# Patient Record
Sex: Male | Born: 1960 | Race: Black or African American | Hispanic: No | Marital: Single | Smoking: Current every day smoker
Health system: Southern US, Community
[De-identification: ages and names within clinical notes are randomized; demographics above are authoritative.]

## PROBLEM LIST (undated history)

## (undated) DIAGNOSIS — I10 Essential (primary) hypertension: Secondary | ICD-10-CM

---

## 2011-07-24 ENCOUNTER — Emergency Department: Payer: Self-pay | Admitting: Emergency Medicine

## 2011-07-24 LAB — COMPREHENSIVE METABOLIC PANEL
Alkaline Phosphatase: 62 U/L (ref 50–136)
Anion Gap: 10 (ref 7–16)
BUN: 12 mg/dL (ref 7–18)
Bilirubin,Total: 0.3 mg/dL (ref 0.2–1.0)
Calcium, Total: 9.3 mg/dL (ref 8.5–10.1)
Chloride: 104 mmol/L (ref 98–107)
Creatinine: 1.26 mg/dL (ref 0.60–1.30)
EGFR (African American): >60
Glucose: 110 mg/dL — ABNORMAL HIGH (ref 65–99)
Osmolality: 285 (ref 275–301)
Potassium: 4.5 mmol/L (ref 3.5–5.1)
SGOT(AST): 25 U/L (ref 15–37)
Sodium: 143 mmol/L (ref 136–145)
Total Protein: 8.3 g/dL — ABNORMAL HIGH (ref 6.4–8.2)

## 2011-07-24 LAB — LIPASE, BLOOD: Lipase: 78 U/L (ref 73–393)

## 2011-07-24 LAB — URINALYSIS, COMPLETE
Bilirubin,UR: NEGATIVE
Blood: NEGATIVE
Ph: 7 (ref 4.5–8.0)
Squamous Epithelial: 1
WBC UR: 20 /HPF (ref 0–5)

## 2011-07-24 LAB — CBC
HCT: 43.9 % (ref 40.0–52.0)
HGB: 14.5 g/dL (ref 13.0–18.0)
MCHC: 33.1 g/dL (ref 32.0–36.0)
MCV: 88 fL (ref 80–100)
Platelet: 236 10*3/uL (ref 150–440)
RBC: 5.02 10*6/uL (ref 4.40–5.90)
RDW: 14.2 % (ref 11.5–14.5)
WBC: 10.5 10*3/uL (ref 3.8–10.6)

## 2012-09-01 IMAGING — CT CT STONE STUDY
1 of 2 series · 16 of 32 positions shown, 20 images · non-contrast
Comparison: none

REASON FOR EXAM: right flank and RUQ pain
COMMENTS:   LMP: (Male)

PROCEDURE:     CT  - CT ABDOMEN /PELVIS WO (STONE)  - July 24, 2011  [DATE]
RESULT:     CT abdomen and pelvis dated 07/24/2011.
TECHNIQUE: Helical noncontrasted 3 mm sections were obtained from lung bases
through the pubic symphysis.

[Series 2: 3mm soft tissue · axial · 0.83mm/px · z∈[-1056,-630]mm · 16 of 156 slices shown, 20 images]
[im 7/156  soft-tissue]
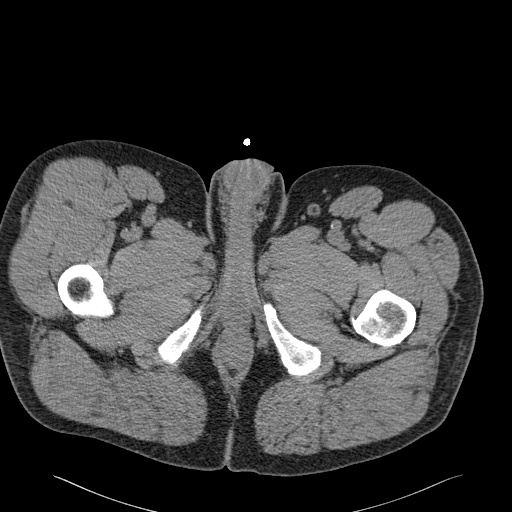
[im 7/156  bone]
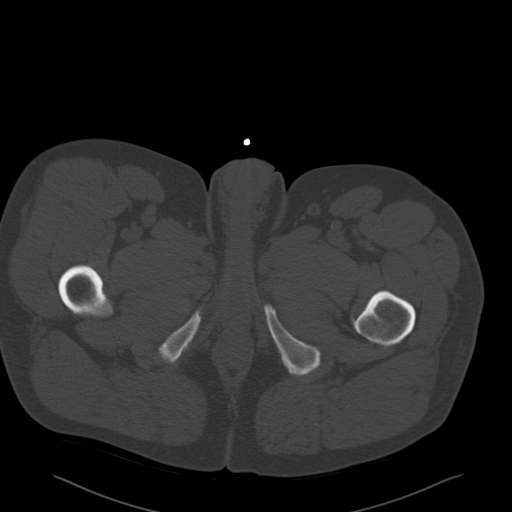
[im 20/156  soft-tissue]
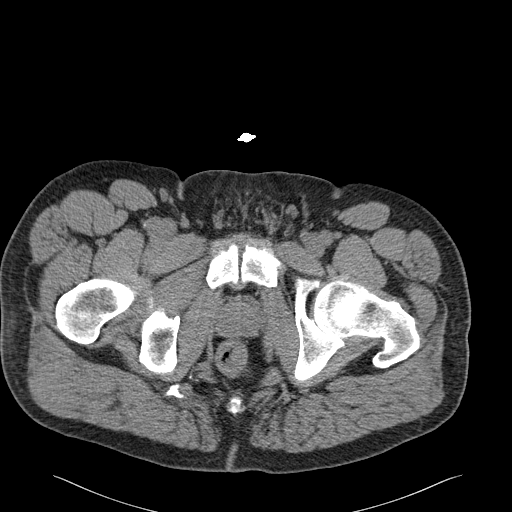
[im 33/156  soft-tissue]
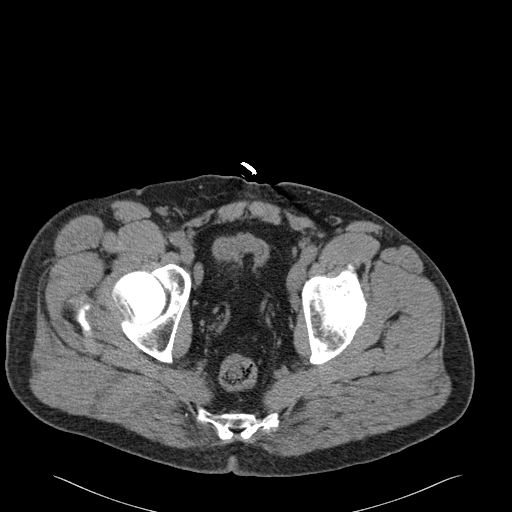
[im 39/156  soft-tissue]
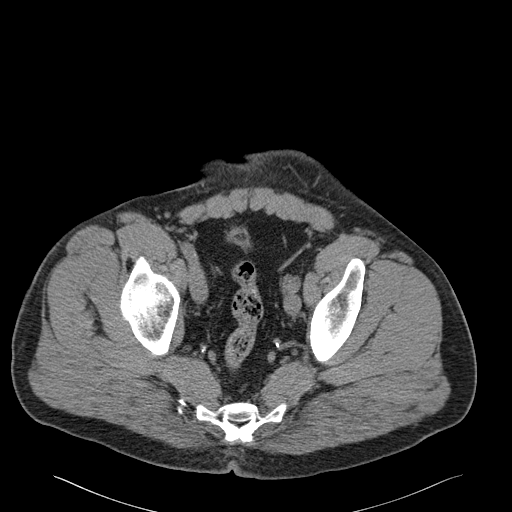
[im 52/156  soft-tissue]
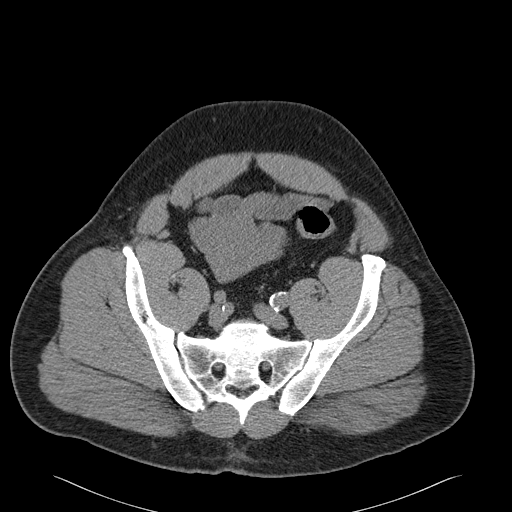
[im 65/156  soft-tissue]
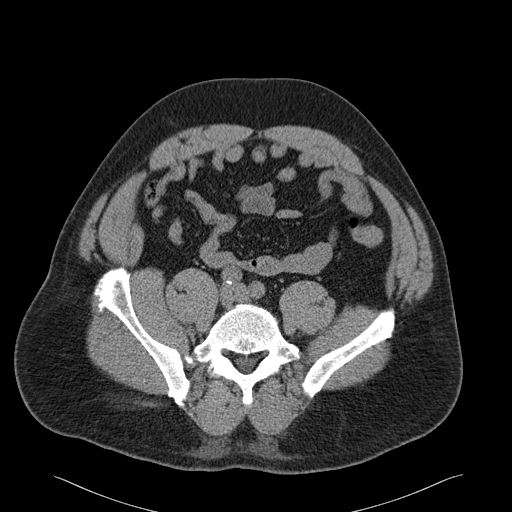
[im 72/156  soft-tissue]
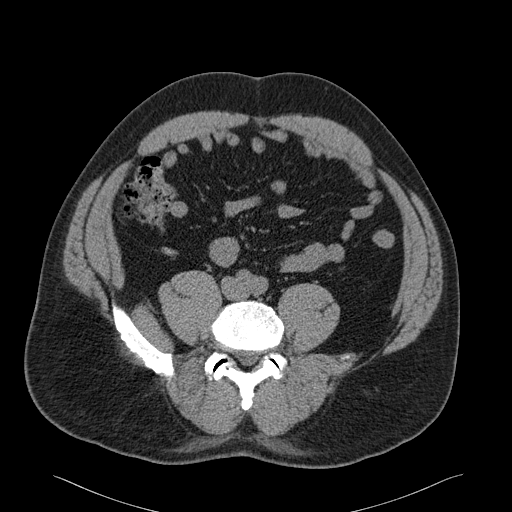
[im 84/156  soft-tissue]
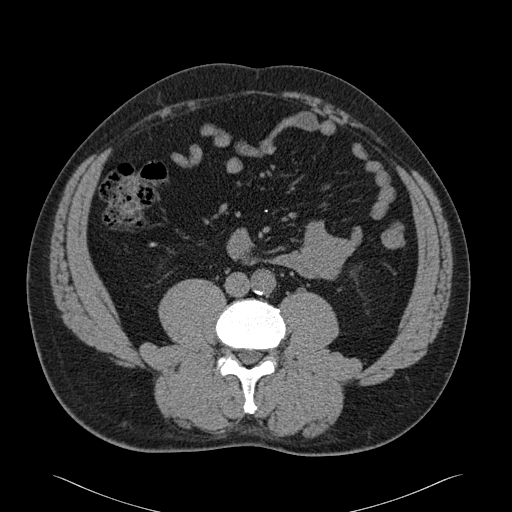
[im 91/156  soft-tissue]
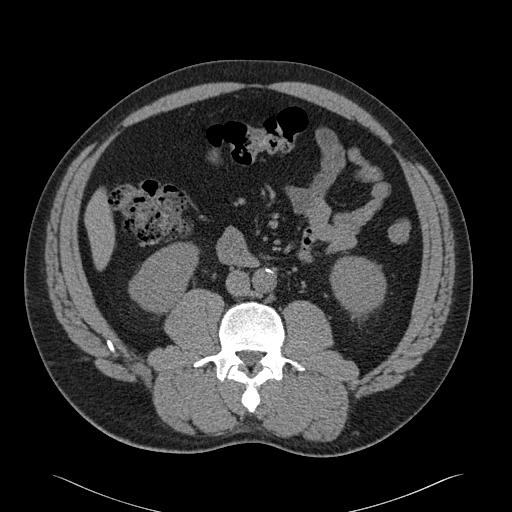
[im 91/156  bone]
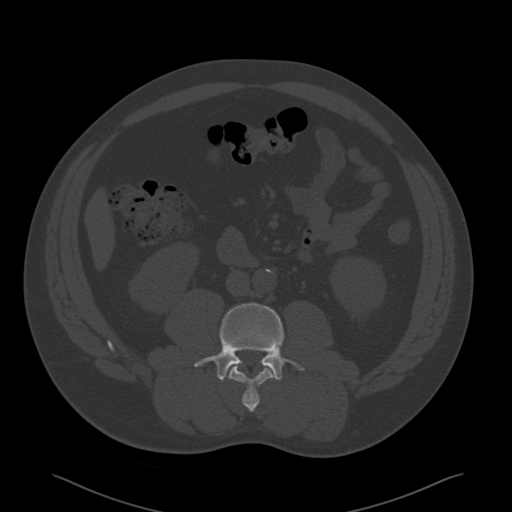
[im 104/156  soft-tissue]
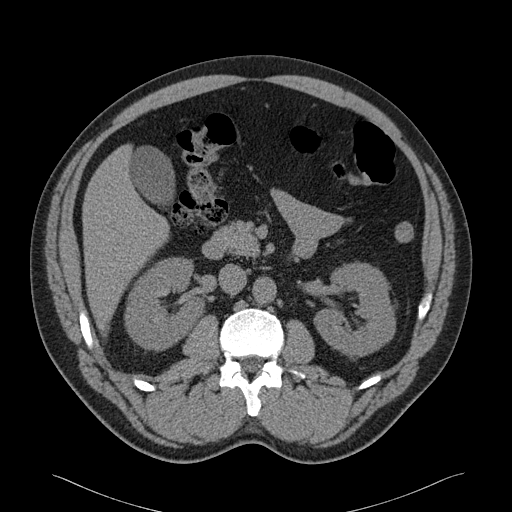
[im 117/156  soft-tissue]
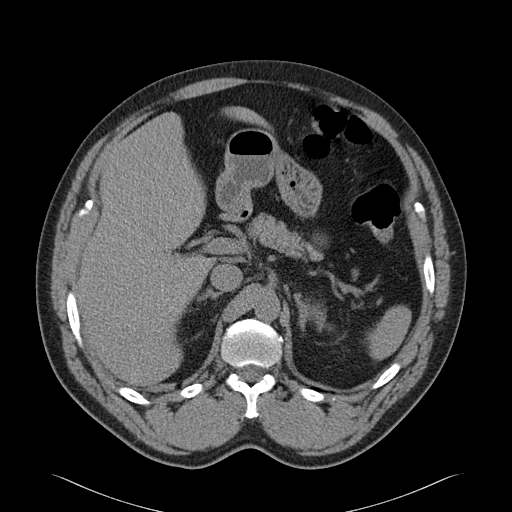
[im 123/156  soft-tissue]
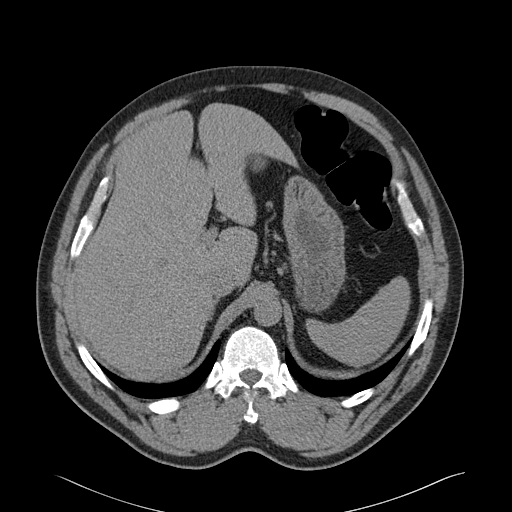
[im 130/156  lung]
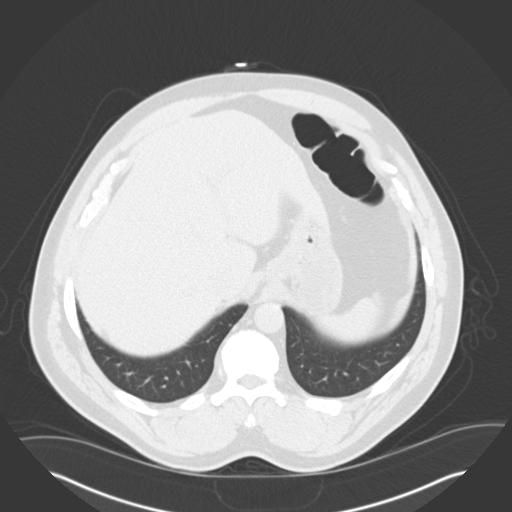
[im 136/156  soft-tissue]
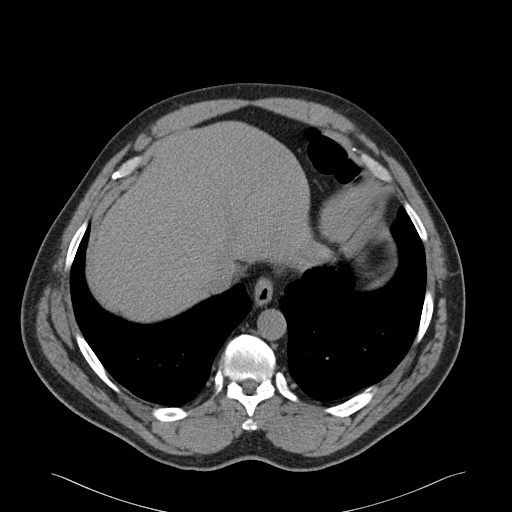
[im 136/156  lung]
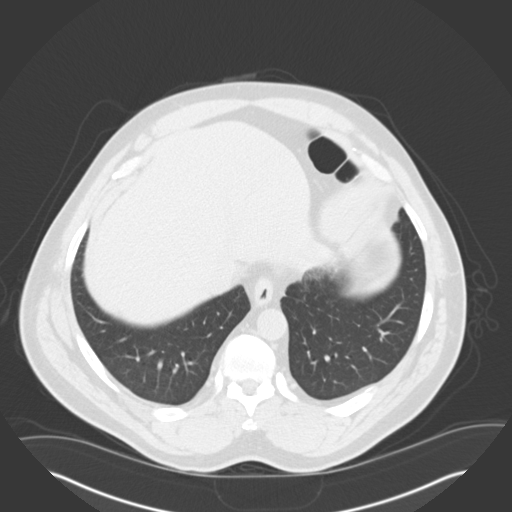
[im 143/156  lung]
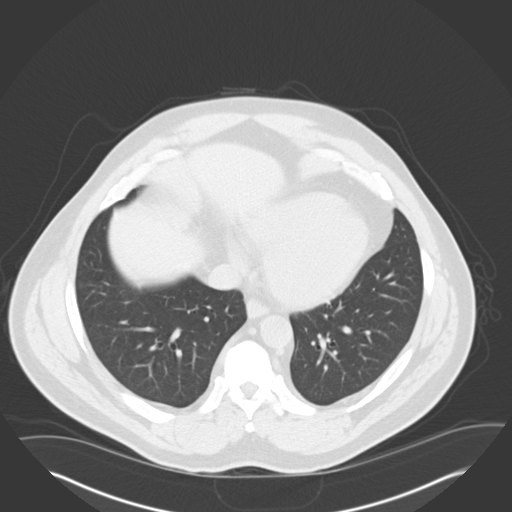
[im 149/156  soft-tissue]
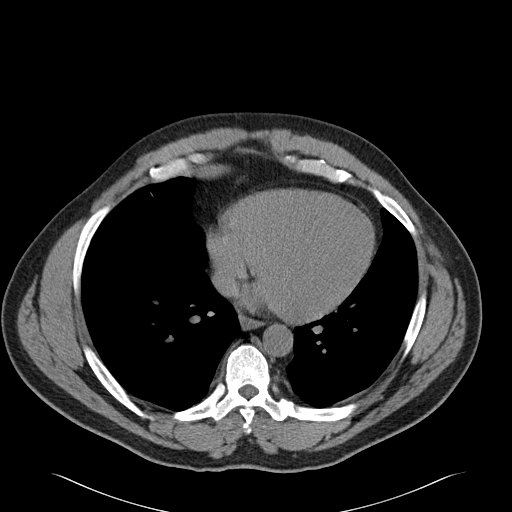
[im 149/156  lung]
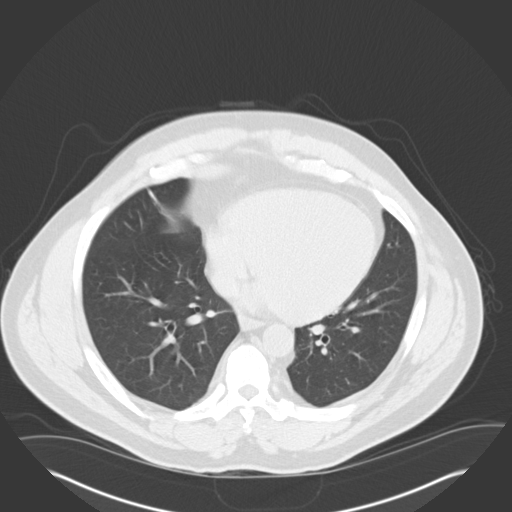

[16 of 32 positions shown; findings below may reference images not displayed]

FINDINGS: The lung bases are unremarkable.

Noncontrast evaluation of the liver, spleen, adrenals, and pancreas are
unremarkable.

There is no evidence of hydronephrosis, hydroureter, nephrolithiasis common
nor ureterolithiasis.

No CT evidence of bowel obstruction, enteritis, colitis, diverticulitis
common nor appendicitis. The appendix is identified and is unremarkable.

No evidence of abdominal aortic aneurysm, free fluid, loculated fluid
collections, nor evidence of masses within the limitations of a
noncontrasted CT.
IMPRESSION: No CT evidence of obstructive or inflammatory abnormalities.

## 2012-09-01 IMAGING — CR DG CHEST 2V
1 series · 2 of 2 positions shown · non-contrast
Comparison: none

REASON FOR EXAM: pain/sob
COMMENTS:

[Series 1: pa · 0.17mm/px · 2 of 2 slices shown]
[im 1/2]
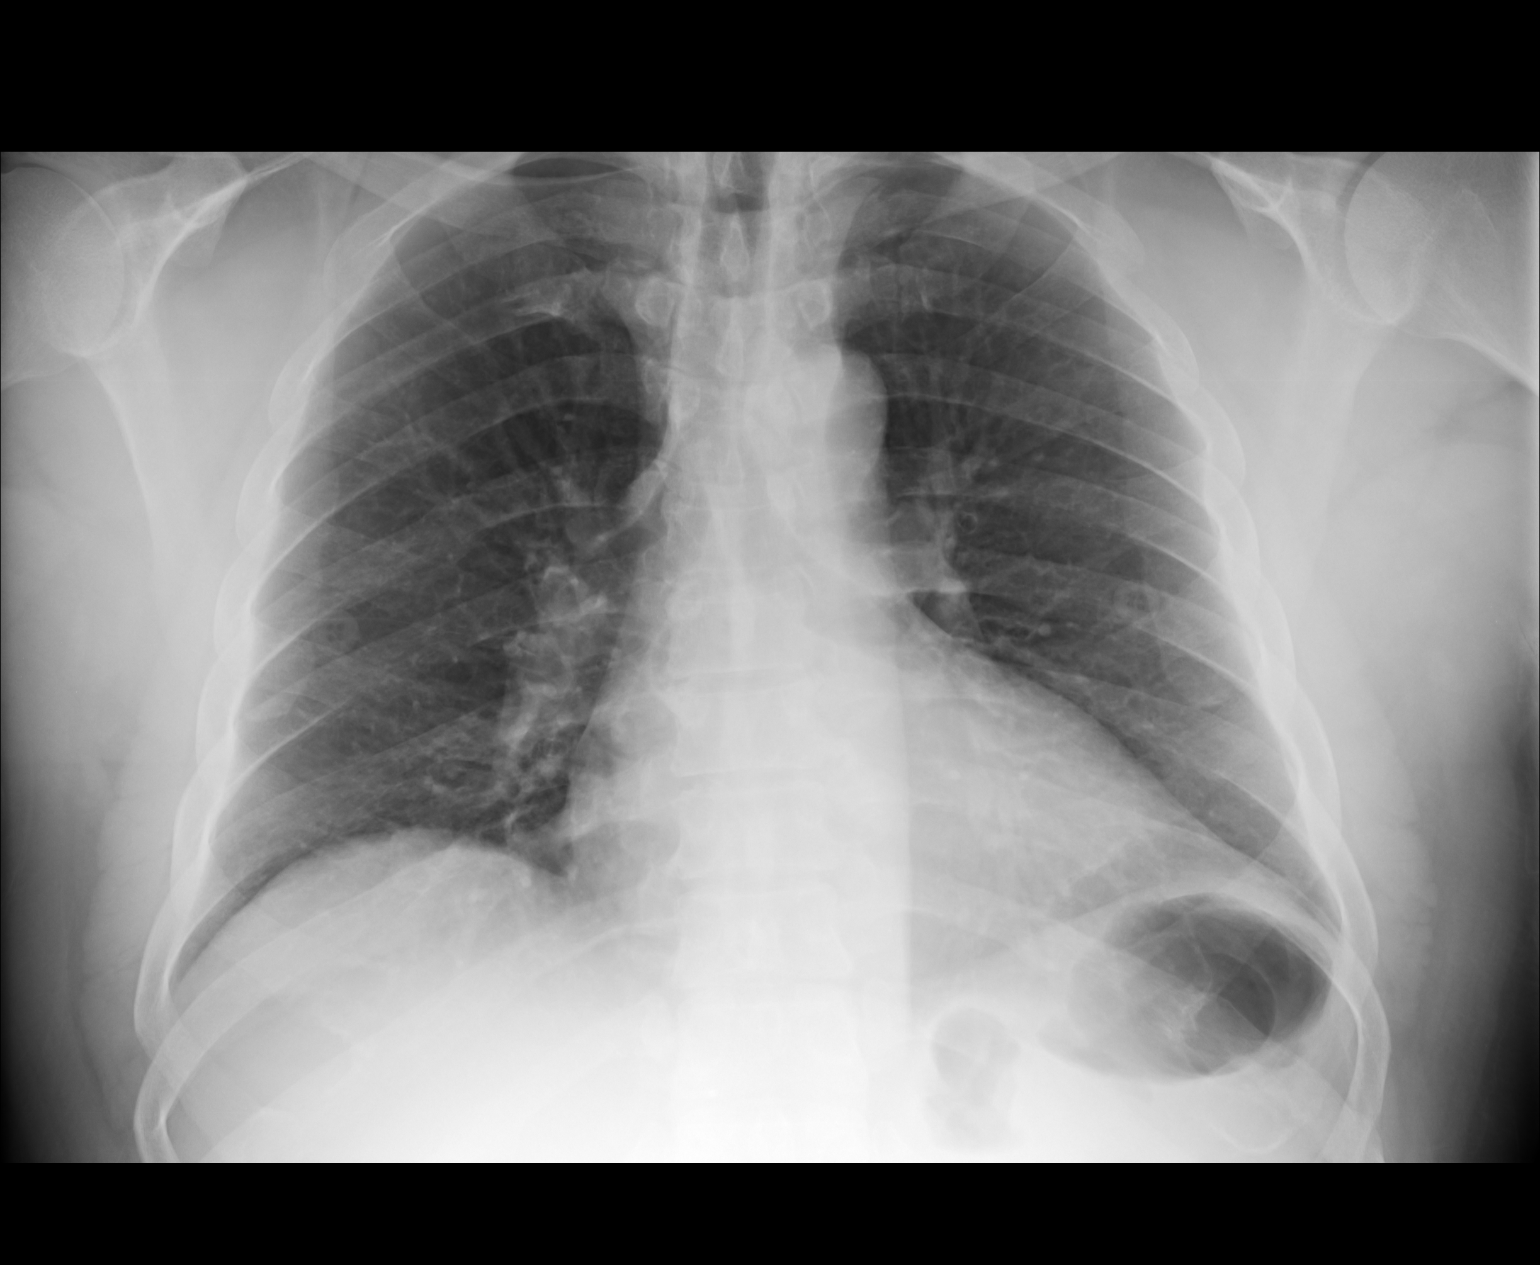
[im 2/2]
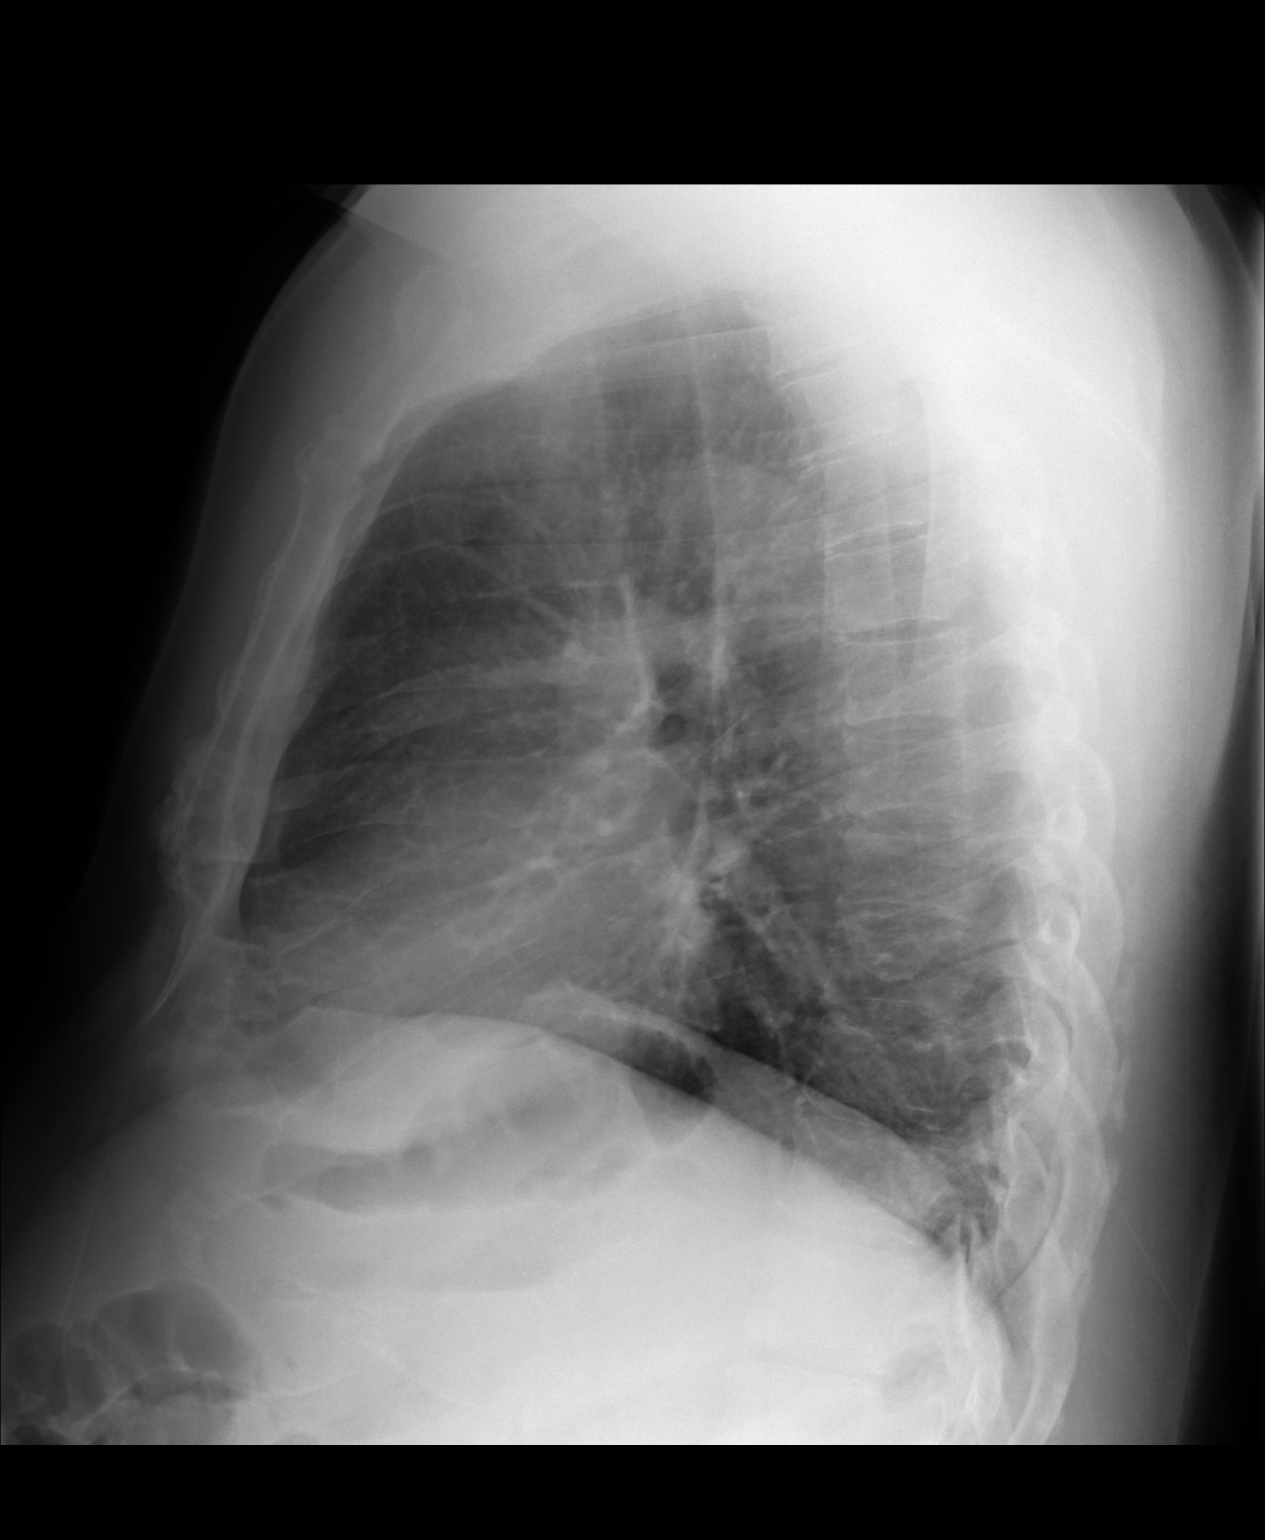

[2 of 2 positions shown; findings below may reference images not displayed]

PROCEDURE:     DXR - DXR CHEST PA (OR AP) AND LATERAL  - July 24, 2011  [DATE]

RESULT:

The patient has taken a shallow inspiration. With technique taken into
consideration, no focal regions of consolidation or focal infiltrates are
appreciated. Shallow inspiration accentuates the lung markings. The cardiac
silhouette and visualized bony skeleton are unremarkable.
IMPRESSION: Shallow inspiration without evidence of acute
cardiopulmonary disease.

## 2014-06-13 ENCOUNTER — Ambulatory Visit: Payer: Self-pay | Admitting: Family Medicine

## 2019-05-05 ENCOUNTER — Telehealth: Payer: Self-pay | Admitting: *Deleted

## 2019-05-05 NOTE — Telephone Encounter (Signed)
Received referral for low dose lung cancer screening CT scan. Message left at phone number listed in EMR for patient to call me back to facilitate scheduling scan.  

## 2019-05-11 ENCOUNTER — Telehealth: Payer: Self-pay | Admitting: *Deleted

## 2019-05-11 ENCOUNTER — Encounter: Payer: Self-pay | Admitting: *Deleted

## 2019-05-11 NOTE — Telephone Encounter (Signed)
Attempted to contact patient via phone to schedule lung screening scan. However, there is no answer or voicemail option. Will mail notification.

## 2019-06-01 ENCOUNTER — Telehealth: Payer: Self-pay | Admitting: *Deleted

## 2019-06-01 NOTE — Telephone Encounter (Signed)
Able to contact patient after multiple attempts to schedule lung screening scan. However, in discussing smoking history, patient reports his smoking history 0.5 to 0.75 ppd x 27 years. Patient is made aware that he does not currently meet eligibility requirements for lung cancer screening. Verbalizes understanding.

## 2022-10-08 ENCOUNTER — Encounter: Payer: Self-pay | Admitting: *Deleted

## 2022-10-15 ENCOUNTER — Ambulatory Visit: Payer: Self-pay | Admitting: Cardiology

## 2023-08-11 ENCOUNTER — Encounter: Payer: Self-pay | Admitting: *Deleted

## 2023-08-21 ENCOUNTER — Telehealth: Payer: Self-pay

## 2023-08-21 ENCOUNTER — Other Ambulatory Visit: Payer: Self-pay | Admitting: *Deleted

## 2023-08-21 ENCOUNTER — Telehealth: Payer: Self-pay | Admitting: *Deleted

## 2023-08-21 DIAGNOSIS — Z1211 Encounter for screening for malignant neoplasm of colon: Secondary | ICD-10-CM

## 2023-08-21 NOTE — Telephone Encounter (Signed)
The patient wife Greg Hernandez) called to schedule colonoscopy.

## 2023-08-21 NOTE — Telephone Encounter (Signed)
Gastroenterology Pre-Procedure Review  Request Date: 10/10/2023 Requesting Physician: Dr. Allegra Lai  PATIENT REVIEW QUESTIONS: The patient responded to the following health history questions as indicated:    1. Are you having any GI issues? no 2. Do you have a personal history of Polyps? no 3. Do you have a family history of Colon Cancer or Polyps? no 4. Diabetes Mellitus? yes (Om) 5. Joint replacements in the past 12 months?no 6. Major health problems in the past 3 months?no 7. Any artificial heart valves, MVP, or defibrillator?no    MEDICATIONS & ALLERGIES:    Patient reports the following regarding taking any anticoagulation/antiplatelet therapy:   Plavix, Coumadin, Eliquis, Xarelto, Lovenox, Pradaxa, Brilinta, or Effient? no Aspirin? yes (81 mg)  Patient confirms/reports the following medications:  Current Outpatient Medications  Medication Sig Dispense Refill   hydrochlorothiazide (HYDRODIURIL) 25 MG tablet Take 1 tablet by mouth daily.     losartan (COZAAR) 100 MG tablet Take 1 tablet by mouth daily.     nicotine polacrilex (NICORETTE) 2 MG gum Take 2 mg by mouth as needed.     rosuvastatin (CRESTOR) 40 MG tablet Take 1 tablet by mouth daily.     Semaglutide,0.25 or 0.5MG /DOS, 2 MG/3ML SOPN Inject into the skin.     aspirin EC 81 MG tablet Take 81 mg by mouth daily.     No current facility-administered medications for this visit.    Patient confirms/reports the following allergies:  Allergies  Allergen Reactions   Metformin Diarrhea    No orders of the defined types were placed in this encounter.   AUTHORIZATION INFORMATION Primary Insurance: 1D#: Group #:  Secondary Insurance: 1D#: Group #:  SCHEDULE INFORMATION: Date: 10/10/2023 Time: Location:  [facility]

## 2023-08-22 MED ORDER — PEG 3350-KCL-NABCB-NACL-NASULF 236 G PO SOLR: 4000.0000 mL | Freq: Once | ORAL | 0 refills | Status: AC

## 2023-08-22 NOTE — Telephone Encounter (Signed)
Colonoscopy schedule with Dr Allegra Lai on 10/10/2023

## 2023-10-06 ENCOUNTER — Other Ambulatory Visit: Payer: Self-pay

## 2023-10-09 ENCOUNTER — Telehealth: Payer: Self-pay

## 2023-10-09 NOTE — Telephone Encounter (Signed)
 The patient wife Joan Flores) left a voicemail inquiring whether her husband can mix the solution with Lemon-Lime Gatorade for his procedure tomorrow morning. I returned her call to confirm receipt of the voicemail and informed her that it is acceptable as long as the Gatorade is not red or purple in color.

## 2023-10-10 ENCOUNTER — Encounter
Admission: RE | Disposition: A | Discharge status: Home / Self Care | Payer: Self-pay | Source: Home / Self Care | Attending: Gastroenterology

## 2023-10-10 ENCOUNTER — Ambulatory Visit
Admission: RE | Admit: 2023-10-10 | Discharge: 2023-10-10 | Disposition: A | Discharge status: Home / Self Care | Payer: Medicaid Other | Attending: Gastroenterology | Admitting: Gastroenterology

## 2023-10-10 ENCOUNTER — Encounter: Payer: Self-pay | Admitting: Gastroenterology

## 2023-10-10 ENCOUNTER — Ambulatory Visit: Admitting: Anesthesiology

## 2023-10-10 DIAGNOSIS — D124 Benign neoplasm of descending colon: Secondary | ICD-10-CM | POA: Diagnosis not present

## 2023-10-10 DIAGNOSIS — D123 Benign neoplasm of transverse colon: Secondary | ICD-10-CM | POA: Diagnosis not present

## 2023-10-10 DIAGNOSIS — Z79899 Other long term (current) drug therapy: Secondary | ICD-10-CM | POA: Insufficient documentation

## 2023-10-10 DIAGNOSIS — E66813 Obesity, class 3: Secondary | ICD-10-CM | POA: Diagnosis not present

## 2023-10-10 DIAGNOSIS — I10 Essential (primary) hypertension: Secondary | ICD-10-CM | POA: Diagnosis not present

## 2023-10-10 DIAGNOSIS — F17210 Nicotine dependence, cigarettes, uncomplicated: Secondary | ICD-10-CM | POA: Diagnosis not present

## 2023-10-10 DIAGNOSIS — Z1211 Encounter for screening for malignant neoplasm of colon: Secondary | ICD-10-CM | POA: Insufficient documentation

## 2023-10-10 DIAGNOSIS — K635 Polyp of colon: Secondary | ICD-10-CM

## 2023-10-10 HISTORY — PX: POLYPECTOMY: SHX5525

## 2023-10-10 HISTORY — DX: Essential (primary) hypertension: I10

## 2023-10-10 HISTORY — PX: COLONOSCOPY WITH PROPOFOL: SHX5780

## 2023-10-10 SURGERY — COLONOSCOPY WITH PROPOFOL
Anesthesia: General

## 2023-10-10 MED ORDER — LIDOCAINE HCL (CARDIAC) PF 100 MG/5ML IV SOSY
PREFILLED_SYRINGE | INTRAVENOUS | Status: DC | PRN
  Administered 2023-10-10: 100 mg via INTRAVENOUS

## 2023-10-10 MED ORDER — PROPOFOL 500 MG/50ML IV EMUL
INTRAVENOUS | Status: DC | PRN
Start: 2023-10-10 — End: 2023-10-10
  Administered 2023-10-10: 100 mg via INTRAVENOUS
  Administered 2023-10-10: 90 ug/kg/min via INTRAVENOUS

## 2023-10-10 MED ORDER — SODIUM CHLORIDE 0.9 % IV SOLN: INTRAVENOUS | Status: DC

## 2023-10-10 NOTE — Op Note (Signed)
 [facility] Gastroenterology Patient Name: Greg Hernandez Procedure Date: 10/10/2023 7:43 AM MRN: [id number] Account #: [id number] Date of Birth: [birth year] Admit Type: Outpatient Age: 63 Room: [facility] ENDO ROOM 2 Gender: Male Note Status: Finalized Instrument Name: Nelda Marseille [id number] Procedure:             Colonoscopy Indications:           Screening for colorectal malignant neoplasm, This is                         the patient's first colonoscopy Providers:             Toney Reil MD, MD Referring MD:          Nat Christen. Zada Finders, MD (Referring MD) Medicines:             General Anesthesia Complications:         No immediate complications. Estimated blood loss: None. Procedure:             Pre-Anesthesia Assessment:                        - Prior to the procedure, a History and Physical was                         performed, and patient medications and allergies were                         reviewed. The patient is competent. The risks and                         benefits of the procedure and the sedation options and                         risks were discussed with the patient. All questions                         were answered and informed consent was obtained.                         Patient identification and proposed procedure were                         verified by the physician, the nurse, the                         anesthesiologist, the anesthetist and the technician                         in the pre-procedure area in the procedure room in the                         endoscopy suite. Mental Status Examination: alert and                         oriented. Airway Examination: normal oropharyngeal                         airway and neck mobility. Respiratory Examination:  clear to auscultation. CV Examination: normal.                         Prophylactic Antibiotics: The patient does not require                         prophylactic  antibiotics. Prior Anticoagulants: The                         patient has taken no anticoagulant or antiplatelet                         agents. ASA Grade Assessment: III - A patient with                         severe systemic disease. After reviewing the risks and                         benefits, the patient was deemed in satisfactory                         condition to undergo the procedure. The anesthesia                         plan was to use general anesthesia. Immediately prior                         to administration of medications, the patient was                         re-assessed for adequacy to receive sedatives. The                         heart rate, respiratory rate, oxygen saturations,                         blood pressure, adequacy of pulmonary ventilation, and                         response to care were monitored throughout the                         procedure. The physical status of the patient was                         re-assessed after the procedure.                        After obtaining informed consent, the colonoscope was                         passed under direct vision. Throughout the procedure,                         the patient's blood pressure, pulse, and oxygen                         saturations were monitored continuously. The  Colonoscope was introduced through the anus and                         advanced to the the cecum, identified by appendiceal                         orifice and ileocecal valve. The colonoscopy was                         performed without difficulty. The patient tolerated                         the procedure well. The quality of the bowel                         preparation was evaluated using the BBPS [facility] Bowel                         Preparation Scale) with scores of: Right Colon = 3,                         Transverse Colon = 3 and Left Colon = 3 (entire mucosa                         seen  well with no residual staining, small fragments                         of stool or opaque liquid). The total BBPS score                         equals 9. The ileocecal valve, appendiceal orifice,                         and rectum were photographed. Findings:      The perianal and digital rectal examinations were normal. Pertinent       negatives include normal sphincter tone and no palpable rectal lesions.      Two sessile polyps were found in the descending colon and transverse       colon. The polyps were diminutive in size. These polyps were removed       with a jumbo cold forceps. Resection and retrieval were complete.      The retroflexed view of the distal rectum and anal verge was normal and       showed no anal or rectal abnormalities. Impression:            - Two diminutive polyps in the descending colon and in                         the transverse colon, removed with a jumbo cold                         forceps. Resected and retrieved.                        - The distal rectum and anal verge are normal on  retroflexion view. Recommendation:        - Discharge patient to home (with escort).                        - Resume previous diet today.                        - Continue present medications.                        - Await pathology results.                        - Repeat colonoscopy in 5 to 7 years for surveillance                         based on pathology results. Procedure Code(s):     --- Professional ---                        [phone], Colonoscopy, flexible; with biopsy, single or                         multiple Diagnosis Code(s):     --- Professional ---                        Z12.11, Encounter for screening for malignant neoplasm                         of colon                        D12.4, Benign neoplasm of descending colon                        D12.3, Benign neoplasm of transverse colon (hepatic                         flexure or  splenic flexure) CPT copyright 2022 American Medical Association. All rights reserved. The codes documented in this report are preliminary and upon coder review may  be revised to meet current compliance requirements. Dr. Libby Maw Toney Reil MD, MD 10/10/2023 8:06:12 AM This report has been signed electronically. Number of Addenda: 0 Note Initiated On: 10/10/2023 7:43 AM Scope Withdrawal Time: 0 hours 11 minutes 11 seconds  Total Procedure Duration: 0 hours 13 minutes 49 seconds  Estimated Blood Loss:  Estimated blood loss: none.      [facility]

## 2023-10-10 NOTE — H&P (Signed)
 Arlyss Repress, MD [address]  [address]  [place], [place] [address]  Main: [phone]  Fax: [phone] Pager: [phone]  Primary Care Physician:  Dione Housekeeper, MD Primary Gastroenterologist:  Dr. Arlyss Repress  Pre-Procedure History & Physical: HPI:  Greg Hernandez is a 63 y.o. male is here for an colonoscopy.   Past Medical History:  Diagnosis Date   Hypertension     History reviewed. No pertinent surgical history.  Prior to Admission medications   Medication Sig Start Date End Date Taking? Authorizing Provider  aspirin EC 81 MG tablet Take 81 mg by mouth daily.   Yes [provider]  hydrochlorothiazide (HYDRODIURIL) 25 MG tablet Take 1 tablet by mouth daily. 08/06/22 05/04/24 Yes [provider]  losartan (COZAAR) 100 MG tablet Take 1 tablet by mouth daily. 05/05/23 05/21/24 Yes [provider]  nicotine polacrilex (NICORETTE) 2 MG gum Take 2 mg by mouth as needed. 05/27/23  Yes [provider]  rosuvastatin (CRESTOR) 40 MG tablet Take 1 tablet by mouth daily. 05/27/23 05/26/24 Yes [provider]  Semaglutide,0.25 or 0.5MG /DOS, 2 MG/3ML SOPN Inject into the skin. 07/28/23 10/26/23  [provider]    Allergies as of 08/21/2023 - never reviewed  Allergen Reaction Noted   Metformin Diarrhea 05/05/2023    History reviewed. No pertinent family history.  Social History   Socioeconomic History   Marital status: Single    Spouse name: Not on file   Number of children: Not on file   Years of education: Not on file   Highest education level: Not on file  Occupational History   Not on file  Tobacco Use   Smoking status: Every Day    Types: Cigarettes   Smokeless tobacco: Never  Vaping Use   Vaping status: Never Used  Substance and Sexual Activity   Alcohol use: Yes   Drug use: Yes    Types: Cocaine    Comment: wednesday   Sexual activity: Not on file  Other Topics Concern   Not on file   Social History Narrative   Not on file   Social Drivers of Health   Financial Resource Strain: High Risk (06/18/2023)   Received from [facility]   Overall Financial Resource Strain (CARDIA)    Difficulty of Paying Living Expenses: Hard  Food Insecurity: Food Insecurity Present (06/18/2023)   Received from [facility]   Hunger Vital Sign    Worried About Running Out of Food in the Last Year: Sometimes true    Ran Out of Food in the Last Year: Sometimes true  Transportation Needs: No Transportation Needs (06/18/2023)   Received from [facility] - Transportation    In the past 12 months, has lack of transportation kept you from medical appointments or from getting medications?: No    Lack of Transportation (Non-Medical): No  Physical Activity: Sufficiently Active (06/30/2023)   Received from [facility]   Exercise Vital Sign    Days of Exercise per Week: 3 days    Minutes of Exercise per Session: 120 min  Stress: No Stress Concern Present (06/30/2023)   Received from [facility] - Occupational Stress Questionnaire    Feeling of Stress : Not at all  Social Connections: Socially Integrated (06/30/2023)   Received from [facility]   Social Connection and Isolation Panel [NHANES]  Frequency of Communication with Friends and Family: More than three times a week    Frequency of Social Gatherings with Friends and Family: Three times a week    Attends Religious Services: More than 4 times per year    Active Member of Clubs or Organizations: No    Attends Engineer, structural: More than 4 times per year    Marital Status: Married  Catering manager Violence: Not on file    Review of Systems: See HPI, otherwise negative ROS  Physical Exam: BP (!) 143/73   Pulse 93   Temp (!) 97 F (36.1 C) (Temporal)   Resp 18    Ht 5\' 7"  (1.702 m)   Wt (!) 140.4 kg   SpO2 98%   BMI 48.49 kg/m  General:   Alert,  pleasant and cooperative in NAD Head:  Normocephalic and atraumatic. Neck:  Supple; no masses or thyromegaly. Lungs:  Clear throughout to auscultation.    Heart:  Regular rate and rhythm. Abdomen:  Soft, nontender and nondistended. Normal bowel sounds, without guarding, and without rebound.   Neurologic:  Alert and  oriented x4;  grossly normal neurologically.  Impression/Plan: Ziah Leandro is here for an colonoscopy to be performed for colon cancer screening  Risks, benefits, limitations, and alternatives regarding  colonoscopy have been reviewed with the patient.  Questions have been answered.  All parties agreeable.   Lannette Donath, MD  10/10/2023, 7:39 AM

## 2023-10-10 NOTE — Anesthesia Preprocedure Evaluation (Signed)
 Anesthesia Evaluation  Patient identified by MRN, date of birth, ID band Patient awake    Reviewed: Allergy & Precautions, NPO status , Patient's Chart, lab work & pertinent test results  Airway Mallampati: III  TM Distance: >3 FB Neck ROM: full    Dental  (+) Teeth Intact   Pulmonary neg pulmonary ROS, Current Smoker and Patient abstained from smoking.   Pulmonary exam normal  + decreased breath sounds      Cardiovascular Exercise Tolerance: Good negative cardio ROS Normal cardiovascular exam Rhythm:Regular Rate:Normal     Neuro/Psych negative neurological ROS  negative psych ROS   GI/Hepatic negative GI ROS, Neg liver ROS,,,  Endo/Other  negative endocrine ROS  Class 3 obesity  Renal/GU negative Renal ROS  negative genitourinary   Musculoskeletal negative musculoskeletal ROS (+)    Abdominal  (+) + obese  Peds negative pediatric ROS (+)  Hematology negative hematology ROS (+)   Anesthesia Other Findings Past Medical History: No date: Hypertension  History reviewed. No pertinent surgical history.     Reproductive/Obstetrics negative OB ROS                             Anesthesia Physical Anesthesia Plan  ASA: 3  Anesthesia Plan: General   Post-op Pain Management:    Induction: Intravenous  PONV Risk Score and Plan: Propofol infusion and TIVA  Airway Management Planned: Natural Airway and Nasal Cannula  Additional Equipment:   Intra-op Plan:   Post-operative Plan:   Informed Consent: I have reviewed the patients History and Physical, chart, labs and discussed the procedure including the risks, benefits and alternatives for the proposed anesthesia with the patient or authorized representative who has indicated his/her understanding and acceptance.     Dental Advisory Given  Plan Discussed with: CRNA  Anesthesia Plan Comments:        Anesthesia Quick  Evaluation

## 2023-10-10 NOTE — Anesthesia Postprocedure Evaluation (Signed)
 Anesthesia Post Note  Patient: Greg Hernandez  Procedure(s) Performed: COLONOSCOPY WITH PROPOFOL POLYPECTOMY  Patient location during evaluation: PACU Anesthesia Type: General Level of consciousness: awake and awake and alert Pain management: satisfactory to patient Vital Signs Assessment: post-procedure vital signs reviewed and stable Respiratory status: spontaneous breathing and nonlabored ventilation Cardiovascular status: stable Anesthetic complications: no   No notable events documented.   Last Vitals:  Vitals:   10/10/23 0810 10/10/23 0828  BP: (!) 140/72 137/81  Pulse: 95 84  Resp: 17 20  Temp: (!) 36.1 C   SpO2: 100% 100%    Last Pain:  Vitals:   10/10/23 0828  TempSrc:   PainSc: 0-No pain                 VAN STAVEREN,Kamori Barbier

## 2023-10-10 NOTE — Transfer of Care (Signed)
 Immediate Anesthesia Transfer of Care Note  Patient: Greg Hernandez  Procedure(s) Performed: COLONOSCOPY WITH PROPOFOL POLYPECTOMY  Patient Location: Endoscopy Unit  Anesthesia Type:General  Level of Consciousness: awake, alert , and oriented  Airway & Oxygen Therapy: Patient Spontanous Breathing  Post-op Assessment: Report given to RN and Post -op Vital signs reviewed and stable  Post vital signs: Reviewed and stable  Last Vitals:  Vitals Value Taken Time  BP 140/72 0809  Temp 35.8 0809  Pulse 79 0809  Resp 16 0809  SpO2 99 0809    Last Pain:  Vitals:   10/10/23 0709  TempSrc: Temporal  PainSc: 0-No pain         Complications: No notable events documented.

## 2023-10-13 LAB — SURGICAL PATHOLOGY

## 2023-10-14 ENCOUNTER — Encounter: Payer: Self-pay | Admitting: Gastroenterology

## 2023-12-04 ENCOUNTER — Other Ambulatory Visit: Payer: Self-pay | Admitting: Emergency Medicine

## 2023-12-04 DIAGNOSIS — R053 Chronic cough: Secondary | ICD-10-CM

## 2023-12-04 DIAGNOSIS — J449 Chronic obstructive pulmonary disease, unspecified: Secondary | ICD-10-CM

## 2023-12-04 DIAGNOSIS — F17210 Nicotine dependence, cigarettes, uncomplicated: Secondary | ICD-10-CM

## 2023-12-04 DIAGNOSIS — Z122 Encounter for screening for malignant neoplasm of respiratory organs: Secondary | ICD-10-CM

## 2023-12-12 ENCOUNTER — Encounter: Payer: Self-pay | Admitting: Nurse Practitioner

## 2023-12-12 ENCOUNTER — Ambulatory Visit
Admission: RE | Admit: 2023-12-12 | Discharge: 2023-12-12 | Disposition: A | Discharge status: Home / Self Care | Source: Ambulatory Visit | Attending: Emergency Medicine | Admitting: Emergency Medicine

## 2023-12-12 DIAGNOSIS — Z122 Encounter for screening for malignant neoplasm of respiratory organs: Secondary | ICD-10-CM | POA: Diagnosis present

## 2023-12-12 DIAGNOSIS — F17210 Nicotine dependence, cigarettes, uncomplicated: Secondary | ICD-10-CM | POA: Insufficient documentation

## 2023-12-12 DIAGNOSIS — J449 Chronic obstructive pulmonary disease, unspecified: Secondary | ICD-10-CM | POA: Diagnosis present

## 2023-12-12 DIAGNOSIS — R053 Chronic cough: Secondary | ICD-10-CM | POA: Diagnosis present

## 2023-12-16 ENCOUNTER — Ambulatory Visit: Attending: Otolaryngology

## 2023-12-16 DIAGNOSIS — R0681 Apnea, not elsewhere classified: Secondary | ICD-10-CM | POA: Insufficient documentation

## 2023-12-16 DIAGNOSIS — R0683 Snoring: Secondary | ICD-10-CM | POA: Diagnosis present

## 2023-12-16 DIAGNOSIS — Z6841 Body Mass Index (BMI) 40.0 and over, adult: Secondary | ICD-10-CM | POA: Insufficient documentation

## 2023-12-16 DIAGNOSIS — R400 Somnolence: Secondary | ICD-10-CM | POA: Diagnosis not present

## 2024-02-06 NOTE — Progress Notes (Signed)
 DIVISION OF PULMONARY AND CRITICAL CARE MEDICINE                                                             PATIENT ENCOUNTER                                         Chief Complaint: Centrilobular emphysema with tobacco dependence   HPI :  [name] is 63 y.o. male who was referred to [facility] Pulmonary clinic for chronic cough and Lung Cancer Screening with current tobacco use.  We reviewed his blood work from prior visit with abnormal allergy panel, normal CRP.   He has LE edema 2+.  He is using advair and doing so twice.  He has PFT with Fev1 62% and mild decrement in DLCO. He smokes actively.  He went to have OSA testing and interface sizing.  His wife had transportation issues and he was unable to get to appt.    Past medical History: He  has a past medical history of Diabetes mellitus without complication (CMS/HHS-HCC), Hyperlipidemia, and Hypertension.  Patient with multiple comorbid conditions complicating medical decision making and management of multi organ dysfunction.  is allergic to metformin.  ROS: 10 point review of systems has been conducted during interview and evaluation today. Remainder has been reviewed and is negative except as per HPI  Current Medications (including changes made at this visit) Current Outpatient Medications  Medication Sig Dispense Refill  . albuterol  MDI, PROVENTIL , VENTOLIN , PROAIR , HFA 90 mcg/actuation inhaler Inhale 2 inhalations into the lungs every 6 (six) hours as needed for Wheezing or Shortness of Breath 1 each 3  . aspirin  81 MG EC tablet Take 81 mg by mouth once daily    . fluticasone  propion-salmeteroL (ADVAIR DISKUS) 250-50 mcg/dose diskus inhaler Inhale 1 Puff into the lungs every 12 (twelve) hours 60 each 1  . hydroCHLOROthiazide (HYDRODIURIL) 25 MG tablet Take 1 tablet (25 mg total) by mouth once daily 90 tablet 3  . ipratropium (ATROVENT) 21 mcg (0.03 %) nasal spray Place 1  spray into both nostrils 2 (two) times daily as needed for Rhinitis 30 mL 11  . losartan  (COZAAR ) 100 MG tablet Take 1 tablet (100 mg total) by mouth once daily 90 tablet 3  . nicotine  (NICODERM CQ ) 7 mg/24 hr patch     . nicotine  polacrilex (NICORETTE) 2 mg gum USE 1 AS GUM TO CHEEK EVERY HOUR AS NEEDED SMOKING CESSATION OR EVERY 1-2 HOURS AS NEEDED    . OZEMPIC 1 mg/dose (4 mg/3 mL) pen injector Inject 1 mg subcutaneously once a week    . rosuvastatin  (CRESTOR ) 40 MG tablet Take 1 tablet (40 mg total) by mouth once daily 90 tablet 3  . GAVILYTE-G solution  (Patient not taking: Reported on 02/06/2024)     No current facility-administered medications for this visit.      Social History:  He  reports that he has been smoking cigarettes. He started  smoking about 38 years ago. He has a 38.5 pack-year smoking history. He has never used smokeless tobacco. He reports current alcohol use. He reports that he does not currently use drugs.  Surgical History:  has no past surgical history on file.  Family History: His family history is not on file.  Physical Exam: BP 121/66 (BP Location: Left upper arm, Patient Position: Sitting, BP Cuff Size: Large Adult)   Pulse 94   Ht 177.2 cm (5' 9.76)   Wt (!) 142.8 kg (314 lb 13.1 oz)   SpO2 97% Comment: room air  BMI 45.48 kg/m  GENERAL: NAD, able to speak in complete sentences without dyspnea or cough HEENT: normocephalic, PERRL, EOMI, TM with sharp light reflex bilaterally.  Normal external auditory canal. ERYTHEMA & HYPERTROPHY of nasal turbinates.  Clear mucosa in mouth without any exudates or erythema. CLEAR POST NASAL DRAINAGE NOTED NECK: Supple, no jugular venous distension, nodes, stridor nor thyromegaly. Trachea midline.  CARDIOVASCULAR: RRR, no murmurs, gallops, rubs RESPIRATORY: normal respiratory effort, DIMINISHED EXPIRATORY auscultation bilaterally. No crackles, or wheezes GASTROINTESTINAL: NABS, soft, non tender, non distended EXTR: No  edema, homans sign or cyanosis LYMPHATIC: No nodes in neck or supraclavicular areas INTEGUMENTARY: No rashes, bruising, telantagias, sclerodactaly nor  alopecia NEURO:  Cranial nerves, motor, sensation intact. Normal gait   Labs & Imaging:  No images available CBC with differential - Normal findings with EOS% 2.9    MedicalDecision Making:   Patient has 1 or more acute or chronic illness or injury that poses a threat to life or bodily function. Review of prior external notes including review of results of blood work and imaging was performed today. Additional tests have been ordered, I have independently reviewed the chart and including microbiology, chemistry, imaging and procedures. I have discussed management and interpretation of testing with patient as well as any referring consultant required for management or comanagement of patient's concerns and medical findings  Impression/Plan:   Patient has not had previous primary care until this last year.  He has had a chronic cough over the last 2 years that is now affecting sleep.  PCP has initiated a sleep study which they will be completing.  Cardiology has been considered and cleared.  At this time initial testing will be completed with a pulmonary function test and a low-dose CT to evaluate for lung cancer.  He has a long history of looking with a 38 pack history.  Spirometry completed today showing a low FEV1 of 36.4%.  FEV1/FVC is normal at 78.5% his FEF 2575 is low at 31.4.  Inhalers are initiated along with education for COPD.  Smoking cessation is reviewed and patient is advised to quit smoking and we have reviewed options such as patches, gum, medications, and behavioral changes.     1. Centrilobular emphysema with moderate restrictive ventilatory defect  Spirometry completed today showing a low FEV1 of 36.4%.  FEV1/FVC is normal at 78.5% his FEF 2575 is low at 31.4.  Labs ordered for further evaluation.  Inhalers started.   10  minutes spent on inhaler education, patient reports understanding instructions at this time. -     CT lung cancer screening; Future -     Alpha-1-Antitrypsin Phenotyp - [facility] -     ANA w/Reflex if Positive - [facility] -     BNP ([facility]) -     Eosinophil Count -     C-Reactive Protein, Quant - [facility] -     Allergens, Zone 3 - [facility] -  B-type natriuretic peptide (BNP); Future  -     fluticasone  propion-salmeteroL (ADVAIR DISKUS) 250-50 mcg/dose diskus inhaler; Inhale 1 Puff into the lungs every 12 (twelve) hours -     albuterol  MDI, PROVENTIL , VENTOLIN , PROAIR , HFA 90 mcg/actuation inhaler; Inhale 2 inhalations into the lungs every 6 (six) hours as needed for Wheezing or Shortness of Breath  2. Chronic cough Most likely due to undiagnosed COPD.  We are initiating inhalers along with recommendation to stop smoking. -     Spirometry; Future -     CT lung cancer screening; Future -     Alpha-1-Antitrypsin Phenotyp - [facility] -     ANA w/Reflex if Positive - [facility] -     BNP ([facility]) -     Eosinophil Count -     C-Reactive Protein, Quant - [facility] -     Allergens, Zone 3 - [facility] -     B-type natriuretic peptide (BNP); Future -     azithromycin (ZITHROMAX) 250 MG tablet; Take 2 tablets (500mg ) by mouth on Day 1. Take 1 tablet (250mg ) by mouth on Days 2-5. -     predniSONE (DELTASONE) 10 MG tablet; Take 2 tablets (20 mg total) by mouth once daily for 3 days, THEN 1 tablet (10 mg total) once daily for 3 days, THEN 0.5 tablets (5 mg total) once daily for 4 days.  3. Encounter for screening for lung cancer -     CT lung cancer screening -initial discussion -Patient is eligible for low-dose CT scan for lung cancer screening based on the criteria noted in the HPI.  -Using a shared decision making tool, (Ex. Healthwise: Lung Cancer: Should I Have Screening?) a thorough discussion of both benefits and harms of low dose CT (LDCT) for lung cancer screening was conducted with the patient. Potential  for overdiagnosis, false positive rate, total radiation exposure and follow-up diagnostic testing were all discussed.  -The importance of of adherence to annual lung cancer LDCT screening, impact of comorbidities and ability or willingness to follow up for diagnosis and treatment were also discussed with the patient. -     CT lung cancer screening; Future   4. Cigarette smoker Tobacco counseling given 5-10 minutes.  Patient is ready to quit smoking at this time.  Patient will let us  know what assistance he wants to be successful at this time.   -     Spirometry; Future -     CT lung cancer screening; Future  5.  Seasonal Rhinitis -     ipratropium (ATROVENT) 21 mcg (0.03 %) nasal spray; Place 1 spray into both nostrils 2 (two) times daily as needed for Rhinitis   6. OSA     - patient already had PSG    - mask and device in process     Prentice Picking, MD [facility]  Division of Pulmonary & Critical Care Medicine

## 2024-03-05 ENCOUNTER — Inpatient Hospital Stay
Admission: EM | Admit: 2024-03-05 | Discharge: 2024-03-11 | DRG: 280 | Disposition: A | Discharge status: Home / Self Care | Attending: Internal Medicine | Admitting: Internal Medicine

## 2024-03-05 DIAGNOSIS — I428 Other cardiomyopathies: Secondary | ICD-10-CM | POA: Diagnosis present

## 2024-03-05 DIAGNOSIS — R079 Chest pain, unspecified: Secondary | ICD-10-CM

## 2024-03-05 DIAGNOSIS — E66813 Obesity, class 3: Secondary | ICD-10-CM | POA: Diagnosis present

## 2024-03-05 DIAGNOSIS — F17210 Nicotine dependence, cigarettes, uncomplicated: Secondary | ICD-10-CM | POA: Diagnosis present

## 2024-03-05 DIAGNOSIS — Z888 Allergy status to other drugs, medicaments and biological substances status: Secondary | ICD-10-CM

## 2024-03-05 DIAGNOSIS — Z79899 Other long term (current) drug therapy: Secondary | ICD-10-CM

## 2024-03-05 DIAGNOSIS — N3000 Acute cystitis without hematuria: Secondary | ICD-10-CM

## 2024-03-05 DIAGNOSIS — J449 Chronic obstructive pulmonary disease, unspecified: Secondary | ICD-10-CM | POA: Diagnosis present

## 2024-03-05 DIAGNOSIS — N179 Acute kidney failure, unspecified: Secondary | ICD-10-CM | POA: Diagnosis not present

## 2024-03-05 DIAGNOSIS — Z7982 Long term (current) use of aspirin: Secondary | ICD-10-CM

## 2024-03-05 DIAGNOSIS — I2510 Atherosclerotic heart disease of native coronary artery without angina pectoris: Secondary | ICD-10-CM | POA: Diagnosis present

## 2024-03-05 DIAGNOSIS — E1151 Type 2 diabetes mellitus with diabetic peripheral angiopathy without gangrene: Secondary | ICD-10-CM | POA: Diagnosis present

## 2024-03-05 DIAGNOSIS — R739 Hyperglycemia, unspecified: Principal | ICD-10-CM

## 2024-03-05 DIAGNOSIS — E1165 Type 2 diabetes mellitus with hyperglycemia: Secondary | ICD-10-CM | POA: Diagnosis present

## 2024-03-05 DIAGNOSIS — I200 Unstable angina: Secondary | ICD-10-CM | POA: Diagnosis present

## 2024-03-05 DIAGNOSIS — G4733 Obstructive sleep apnea (adult) (pediatric): Secondary | ICD-10-CM | POA: Diagnosis present

## 2024-03-05 DIAGNOSIS — I4891 Unspecified atrial fibrillation: Secondary | ICD-10-CM | POA: Diagnosis present

## 2024-03-05 DIAGNOSIS — E785 Hyperlipidemia, unspecified: Secondary | ICD-10-CM | POA: Diagnosis present

## 2024-03-05 DIAGNOSIS — I70219 Atherosclerosis of native arteries of extremities with intermittent claudication, unspecified extremity: Secondary | ICD-10-CM | POA: Diagnosis present

## 2024-03-05 DIAGNOSIS — Z6841 Body Mass Index (BMI) 40.0 and over, adult: Secondary | ICD-10-CM

## 2024-03-05 DIAGNOSIS — F1910 Other psychoactive substance abuse, uncomplicated: Secondary | ICD-10-CM | POA: Diagnosis present

## 2024-03-05 DIAGNOSIS — F17200 Nicotine dependence, unspecified, uncomplicated: Secondary | ICD-10-CM | POA: Diagnosis present

## 2024-03-05 DIAGNOSIS — I110 Hypertensive heart disease with heart failure: Secondary | ICD-10-CM | POA: Diagnosis present

## 2024-03-05 DIAGNOSIS — I214 Non-ST elevation (NSTEMI) myocardial infarction: Principal | ICD-10-CM | POA: Diagnosis present

## 2024-03-05 DIAGNOSIS — I5021 Acute systolic (congestive) heart failure: Secondary | ICD-10-CM | POA: Diagnosis present

## 2024-03-05 DIAGNOSIS — F1410 Cocaine abuse, uncomplicated: Secondary | ICD-10-CM | POA: Diagnosis present

## 2024-03-05 DIAGNOSIS — I70202 Unspecified atherosclerosis of native arteries of extremities, left leg: Secondary | ICD-10-CM | POA: Diagnosis present

## 2024-03-05 LAB — CBC
HCT: 39.3 % (ref 39.0–52.0)
Hemoglobin: 13.3 g/dL (ref 13.0–17.0)
MCH: 28.4 pg (ref 26.0–34.0)
MCHC: 33.8 g/dL (ref 30.0–36.0)
MCV: 84 fL (ref 80.0–100.0)
Platelets: 232 K/uL (ref 150–400)
RBC: 4.68 MIL/uL (ref 4.22–5.81)
RDW: 13.4 % (ref 11.5–15.5)
WBC: 7.7 K/uL (ref 4.0–10.5)
nRBC: 0 % (ref 0.0–0.2)

## 2024-03-05 LAB — CBG MONITORING, ED: Glucose-Capillary: >600 mg/dL (ref 70–99)

## 2024-03-05 NOTE — ED Triage Notes (Signed)
 States he smoked crack Thursday

## 2024-03-05 NOTE — ED Triage Notes (Signed)
 Pt. In via POV from home, reports BSG 595 at home, has been running high for 1 week, HX: hypertension, hyperlipidemia, kidney disease, T2DM

## 2024-03-05 NOTE — ED Notes (Signed)
 Introduced self to pt and wife. Pt attempting to get urine sample at this time. Call bell in reach.

## 2024-03-06 ENCOUNTER — Other Ambulatory Visit: Payer: Self-pay

## 2024-03-06 ENCOUNTER — Emergency Department

## 2024-03-06 ENCOUNTER — Observation Stay
Admit: 2024-03-06 | Discharge: 2024-03-06 | Disposition: A | Discharge status: Home / Self Care | Attending: Internal Medicine | Admitting: Internal Medicine

## 2024-03-06 DIAGNOSIS — I200 Unstable angina: Secondary | ICD-10-CM | POA: Diagnosis not present

## 2024-03-06 DIAGNOSIS — E66813 Obesity, class 3: Secondary | ICD-10-CM | POA: Diagnosis present

## 2024-03-06 DIAGNOSIS — J449 Chronic obstructive pulmonary disease, unspecified: Secondary | ICD-10-CM | POA: Diagnosis present

## 2024-03-06 DIAGNOSIS — E785 Hyperlipidemia, unspecified: Secondary | ICD-10-CM | POA: Diagnosis present

## 2024-03-06 DIAGNOSIS — R0609 Other forms of dyspnea: Secondary | ICD-10-CM | POA: Insufficient documentation

## 2024-03-06 DIAGNOSIS — G4733 Obstructive sleep apnea (adult) (pediatric): Secondary | ICD-10-CM | POA: Diagnosis present

## 2024-03-06 DIAGNOSIS — F1910 Other psychoactive substance abuse, uncomplicated: Secondary | ICD-10-CM | POA: Diagnosis present

## 2024-03-06 DIAGNOSIS — E1165 Type 2 diabetes mellitus with hyperglycemia: Secondary | ICD-10-CM | POA: Diagnosis present

## 2024-03-06 DIAGNOSIS — F17200 Nicotine dependence, unspecified, uncomplicated: Secondary | ICD-10-CM | POA: Diagnosis present

## 2024-03-06 DIAGNOSIS — I70219 Atherosclerosis of native arteries of extremities with intermittent claudication, unspecified extremity: Secondary | ICD-10-CM | POA: Diagnosis present

## 2024-03-06 LAB — TROPONIN I (HIGH SENSITIVITY)
Troponin I (High Sensitivity): 317 ng/L (ref <18)
Troponin I (High Sensitivity): 392 ng/L (ref <18)

## 2024-03-06 LAB — CBG MONITORING, ED
Glucose-Capillary: 375 mg/dL — ABNORMAL HIGH (ref 70–99)
Glucose-Capillary: 284 mg/dL — ABNORMAL HIGH (ref 70–99)
Glucose-Capillary: 175 mg/dL — ABNORMAL HIGH (ref 70–99)
Glucose-Capillary: 210 mg/dL — ABNORMAL HIGH (ref 70–99)

## 2024-03-06 LAB — BASIC METABOLIC PANEL WITH GFR
Anion gap: 8 (ref 5–15)
Anion gap: 8 (ref 5–15)
BUN: 19 mg/dL (ref 8–23)
BUN: 25 mg/dL — ABNORMAL HIGH (ref 8–23)
CO2: 25 mmol/L (ref 22–32)
CO2: 25 mmol/L (ref 22–32)
Calcium: 9.1 mg/dL (ref 8.9–10.3)
Calcium: 9.4 mg/dL (ref 8.9–10.3)
Chloride: 103 mmol/L (ref 98–111)
Chloride: 97 mmol/L — ABNORMAL LOW (ref 98–111)
Creatinine, Ser: 1.23 mg/dL (ref 0.61–1.24)
Creatinine, Ser: 1.56 mg/dL — ABNORMAL HIGH (ref 0.61–1.24)
GFR, Estimated: 50 mL/min — ABNORMAL LOW (ref >60)
GFR, Estimated: >60 mL/min (ref >60)
Glucose, Bld: 184 mg/dL — ABNORMAL HIGH (ref 70–99)
Glucose, Bld: 579 mg/dL (ref 70–99)
Potassium: 3.9 mmol/L (ref 3.5–5.1)
Potassium: 4.1 mmol/L (ref 3.5–5.1)
Sodium: 130 mmol/L — ABNORMAL LOW (ref 135–145)
Sodium: 136 mmol/L (ref 135–145)

## 2024-03-06 LAB — HEPARIN LEVEL (UNFRACTIONATED)
Heparin Unfractionated: 0.18 [IU]/mL — ABNORMAL LOW (ref 0.30–0.70)
Heparin Unfractionated: <0.1 [IU]/mL — ABNORMAL LOW (ref 0.30–0.70)

## 2024-03-06 LAB — URINALYSIS, ROUTINE W REFLEX MICROSCOPIC
Bilirubin Urine: NEGATIVE
Glucose, UA: >=500 mg/dL — AB
Hgb urine dipstick: NEGATIVE
Ketones, ur: 5 mg/dL — AB
Nitrite: NEGATIVE
Protein, ur: NEGATIVE mg/dL
Specific Gravity, Urine: 1.023 (ref 1.005–1.030)
pH: 7 (ref 5.0–8.0)

## 2024-03-06 LAB — ECHOCARDIOGRAM COMPLETE
AR max vel: 2.01 cm2
AV Area VTI: 2.28 cm2
AV Area mean vel: 1.9 cm2
AV Mean grad: 4 mmHg
AV Peak grad: 9.1 mmHg
Ao pk vel: 1.51 m/s
Area-P 1/2: 3.56 cm2
Calc EF: 36.9 %
Height: 69 in
MV VTI: 2.62 cm2
S' Lateral: 4.7 cm
Single Plane A2C EF: 34.8 %
Single Plane A4C EF: 36.1 %
Weight: 4896 [oz_av]

## 2024-03-06 LAB — CBC
HCT: 40.1 % (ref 39.0–52.0)
Hemoglobin: 13.1 g/dL (ref 13.0–17.0)
MCH: 27.8 pg (ref 26.0–34.0)
MCHC: 32.7 g/dL (ref 30.0–36.0)
MCV: 85 fL (ref 80.0–100.0)
Platelets: 233 K/uL (ref 150–400)
RBC: 4.72 MIL/uL (ref 4.22–5.81)
RDW: 13.5 % (ref 11.5–15.5)
WBC: 7.1 K/uL (ref 4.0–10.5)
nRBC: 0 % (ref 0.0–0.2)

## 2024-03-06 LAB — BLOOD GAS, VENOUS
Acid-Base Excess: 4.4 mmol/L — ABNORMAL HIGH (ref 0.0–2.0)
Bicarbonate: 29.2 mmol/L — ABNORMAL HIGH (ref 20.0–28.0)
O2 Saturation: 95.4 %
Patient temperature: 37
pCO2, Ven: 43 mmHg — ABNORMAL LOW (ref 44–60)
pH, Ven: 7.44 — ABNORMAL HIGH (ref 7.25–7.43)
pO2, Ven: 68 mmHg — ABNORMAL HIGH (ref 32–45)

## 2024-03-06 LAB — URINE DRUG SCREEN, QUALITATIVE (ARMC ONLY)
Amphetamines, Ur Screen: NOT DETECTED
Barbiturates, Ur Screen: NOT DETECTED
Benzodiazepine, Ur Scrn: NOT DETECTED
Cannabinoid 50 Ng, Ur ~~LOC~~: NOT DETECTED
Cocaine Metabolite,Ur ~~LOC~~: POSITIVE — AB
MDMA (Ecstasy)Ur Screen: NOT DETECTED
Methadone Scn, Ur: NOT DETECTED
Opiate, Ur Screen: NOT DETECTED
Phencyclidine (PCP) Ur S: NOT DETECTED
Tricyclic, Ur Screen: NOT DETECTED

## 2024-03-06 LAB — OSMOLALITY, URINE: Osmolality, Ur: 547 mosm/kg (ref 300–900)

## 2024-03-06 LAB — HIV ANTIBODY (ROUTINE TESTING W REFLEX): HIV Screen 4th Generation wRfx: NONREACTIVE

## 2024-03-06 LAB — APTT: aPTT: 27 s (ref 24–36)

## 2024-03-06 LAB — BETA-HYDROXYBUTYRIC ACID: Beta-Hydroxybutyric Acid: 0.44 mmol/L — ABNORMAL HIGH (ref 0.05–0.27)

## 2024-03-06 LAB — BRAIN NATRIURETIC PEPTIDE: B Natriuretic Peptide: 9.6 pg/mL (ref 0.0–100.0)

## 2024-03-06 LAB — OSMOLALITY: Osmolality: 309 mosm/kg — ABNORMAL HIGH (ref 275–295)

## 2024-03-06 LAB — GLUCOSE, CAPILLARY: Glucose-Capillary: 230 mg/dL — ABNORMAL HIGH (ref 70–99)

## 2024-03-06 MED ORDER — INSULIN GLARGINE-YFGN 100 UNIT/ML ~~LOC~~ SOLN
20.0000 [IU] | Freq: Every day | SUBCUTANEOUS | Status: DC
  Administered 2024-03-06 – 2024-03-09 (×4): 20 [IU] via SUBCUTANEOUS
  Filled 2024-03-06 (×5): qty 0.2

## 2024-03-06 MED ORDER — FLUTICASONE FUROATE-VILANTEROL 200-25 MCG/ACT IN AEPB
1.0000 | INHALATION_SPRAY | Freq: Every day | RESPIRATORY_TRACT | Status: DC
  Administered 2024-03-06 – 2024-03-10 (×5): 1 via RESPIRATORY_TRACT
  Filled 2024-03-06: qty 28

## 2024-03-06 MED ORDER — ASPIRIN 81 MG PO CHEW
324.0000 mg | CHEWABLE_TABLET | Freq: Once | ORAL | Status: AC
  Administered 2024-03-06: 324 mg via ORAL
  Filled 2024-03-06: qty 4

## 2024-03-06 MED ORDER — MORPHINE SULFATE (PF) 2 MG/ML IV SOLN: 2.0000 mg | INTRAVENOUS | Status: DC | PRN

## 2024-03-06 MED ORDER — HEPARIN BOLUS VIA INFUSION
3100.0000 [IU] | Freq: Once | INTRAVENOUS | Status: AC
  Administered 2024-03-06: 3100 [IU] via INTRAVENOUS
  Filled 2024-03-06: qty 3100

## 2024-03-06 MED ORDER — ALPRAZOLAM 0.25 MG PO TABS: 0.2500 mg | ORAL_TABLET | Freq: Two times a day (BID) | ORAL | Status: DC | PRN

## 2024-03-06 MED ORDER — LACTATED RINGERS IV SOLN: INTRAVENOUS | Status: DC

## 2024-03-06 MED ORDER — SODIUM CHLORIDE 0.9 % IV BOLUS: 500.0000 mL | Freq: Once | INTRAVENOUS | Status: DC

## 2024-03-06 MED ORDER — ROSUVASTATIN CALCIUM 10 MG PO TABS
40.0000 mg | ORAL_TABLET | Freq: Every day | ORAL | Status: DC
  Administered 2024-03-06 – 2024-03-10 (×5): 40 mg via ORAL
  Filled 2024-03-06 (×3): qty 4
  Filled 2024-03-06: qty 2
  Filled 2024-03-06 (×2): qty 4
  Filled 2024-03-06: qty 2

## 2024-03-06 MED ORDER — INSULIN ASPART 100 UNIT/ML IJ SOLN
0.0000 [IU] | Freq: Three times a day (TID) | INTRAMUSCULAR | Status: DC
  Administered 2024-03-06 – 2024-03-07 (×3): 7 [IU] via SUBCUTANEOUS
  Administered 2024-03-07: 11 [IU] via SUBCUTANEOUS
  Administered 2024-03-08: 7 [IU] via SUBCUTANEOUS
  Administered 2024-03-08: 4 [IU] via SUBCUTANEOUS
  Administered 2024-03-08: 7 [IU] via SUBCUTANEOUS
  Administered 2024-03-09 – 2024-03-10 (×4): 4 [IU] via SUBCUTANEOUS
  Filled 2024-03-06 (×11): qty 1

## 2024-03-06 MED ORDER — METOPROLOL SUCCINATE ER 25 MG PO TB24
25.0000 mg | ORAL_TABLET | Freq: Every day | ORAL | Status: DC
  Administered 2024-03-07 – 2024-03-11 (×5): 25 mg via ORAL
  Filled 2024-03-06 (×5): qty 1

## 2024-03-06 MED ORDER — ACETAMINOPHEN 325 MG PO TABS: 650.0000 mg | ORAL_TABLET | ORAL | Status: DC | PRN

## 2024-03-06 MED ORDER — ONDANSETRON HCL 4 MG/2ML IJ SOLN
4.0000 mg | Freq: Four times a day (QID) | INTRAMUSCULAR | Status: DC | PRN
Start: 2024-03-06 — End: 2024-03-10

## 2024-03-06 MED ORDER — INSULIN ASPART 100 UNIT/ML IJ SOLN: 0.0000 [IU] | Freq: Three times a day (TID) | INTRAMUSCULAR | Status: DC

## 2024-03-06 MED ORDER — DEXTROSE IN LACTATED RINGERS 5 % IV SOLN: INTRAVENOUS | Status: DC

## 2024-03-06 MED ORDER — PERFLUTREN LIPID MICROSPHERE
1.0000 mL | INTRAVENOUS | Status: AC | PRN
  Administered 2024-03-06: 2 mL via INTRAVENOUS

## 2024-03-06 MED ORDER — ALBUTEROL SULFATE (2.5 MG/3ML) 0.083% IN NEBU: 2.5000 mg | INHALATION_SOLUTION | RESPIRATORY_TRACT | Status: DC | PRN

## 2024-03-06 MED ORDER — SODIUM CHLORIDE 0.9 % IV SOLN
1.0000 g | Freq: Once | INTRAVENOUS | Status: AC
  Administered 2024-03-06: 1 g via INTRAVENOUS
  Filled 2024-03-06: qty 10

## 2024-03-06 MED ORDER — INSULIN ASPART 100 UNIT/ML IJ SOLN
0.0000 [IU] | INTRAMUSCULAR | Status: DC
  Administered 2024-03-06: 4 [IU] via SUBCUTANEOUS
  Administered 2024-03-06: 11 [IU] via SUBCUTANEOUS
  Administered 2024-03-06: 4 [IU] via SUBCUTANEOUS
  Administered 2024-03-06: 20 [IU] via SUBCUTANEOUS
  Filled 2024-03-06: qty 1
  Filled 2024-03-06: qty 20
  Filled 2024-03-06: qty 11

## 2024-03-06 MED ORDER — LOSARTAN POTASSIUM 50 MG PO TABS
100.0000 mg | ORAL_TABLET | Freq: Every day | ORAL | Status: DC
  Administered 2024-03-06 – 2024-03-09 (×4): 100 mg via ORAL
  Filled 2024-03-06 (×4): qty 2

## 2024-03-06 MED ORDER — SODIUM CHLORIDE 0.9 % IV BOLUS
1000.0000 mL | Freq: Once | INTRAVENOUS | Status: AC
  Administered 2024-03-06: 1000 mL via INTRAVENOUS

## 2024-03-06 MED ORDER — MONTELUKAST SODIUM 10 MG PO TABS
10.0000 mg | ORAL_TABLET | Freq: Every day | ORAL | Status: DC
  Administered 2024-03-06 – 2024-03-10 (×5): 10 mg via ORAL
  Filled 2024-03-06 (×5): qty 1

## 2024-03-06 MED ORDER — DEXTROSE 50 % IV SOLN: 0.0000 mL | INTRAVENOUS | Status: DC | PRN

## 2024-03-06 MED ORDER — NITROGLYCERIN 0.4 MG SL SUBL: 0.4000 mg | SUBLINGUAL_TABLET | SUBLINGUAL | Status: DC | PRN

## 2024-03-06 MED ORDER — INSULIN REGULAR(HUMAN) IN NACL 100-0.9 UT/100ML-% IV SOLN: INTRAVENOUS | Status: DC

## 2024-03-06 MED ORDER — HEPARIN BOLUS VIA INFUSION
3000.0000 [IU] | Freq: Once | INTRAVENOUS | Status: AC
  Administered 2024-03-06: 3000 [IU] via INTRAVENOUS
  Filled 2024-03-06: qty 3000

## 2024-03-06 MED ORDER — HEPARIN BOLUS VIA INFUSION
4000.0000 [IU] | Freq: Once | INTRAVENOUS | Status: AC
  Administered 2024-03-06: 4000 [IU] via INTRAVENOUS
  Filled 2024-03-06: qty 4000

## 2024-03-06 MED ORDER — METOPROLOL SUCCINATE ER 25 MG PO TB24: 25.0000 mg | ORAL_TABLET | Freq: Every day | ORAL | Status: DC

## 2024-03-06 MED ORDER — NICOTINE 14 MG/24HR TD PT24
14.0000 mg | MEDICATED_PATCH | Freq: Every day | TRANSDERMAL | Status: DC
  Administered 2024-03-06 – 2024-03-11 (×5): 14 mg via TRANSDERMAL
  Filled 2024-03-06 (×6): qty 1

## 2024-03-06 MED ORDER — HEPARIN (PORCINE) 25000 UT/250ML-% IV SOLN
2000.0000 [IU]/h | INTRAVENOUS | Status: DC
  Administered 2024-03-06: 2000 [IU]/h via INTRAVENOUS
  Administered 2024-03-06: 1450 [IU]/h via INTRAVENOUS
  Administered 2024-03-07 – 2024-03-09 (×4): 2000 [IU]/h via INTRAVENOUS
  Filled 2024-03-06 (×7): qty 250

## 2024-03-06 MED ORDER — ASPIRIN 81 MG PO TBEC
81.0000 mg | DELAYED_RELEASE_TABLET | Freq: Every day | ORAL | Status: DC
  Administered 2024-03-07 – 2024-03-11 (×5): 81 mg via ORAL
  Filled 2024-03-06 (×5): qty 1

## 2024-03-06 MED ORDER — INSULIN ASPART 100 UNIT/ML IJ SOLN
10.0000 [IU] | Freq: Once | INTRAMUSCULAR | Status: AC
  Administered 2024-03-06: 10 [IU] via INTRAVENOUS
  Filled 2024-03-06: qty 10

## 2024-03-06 NOTE — Progress Notes (Signed)
 Progress Note   Patient: Greg Hernandez FMW:[id number] DOB: 05-Apr-1961 DOA: 03/05/2024     0 DOS: the patient was seen and examined on 03/06/2024   Brief hospital course: 63yo with h/o HTN, PAD, HLD, COPD, DM, tobacco use, cocaine use, and morbid obesity who presented on 8/16 with CP/SOB.  Troponin elevated.  C/W NSTEMI, started on Heparin .  Echo with acute systolic dysfunction, EF 35-40% with WMA and grade 1 DD, consistent with MI.  Cardiology consulting.   Assessment and Plan:  NSTEMI Patient presented with CP and DOE Heparin  infusion Aspirin , continue home rosuvastatin  and losartan  Will hold beta-blocker for now given substance use - he is not on one at home but likely needs to start one that is safe regardless of cocaine use Nitroglycerin  sublingual as needed chest pain with morphine  for breakthrough Echocardiogram done and c/w NSTEMI Cardiology consulted Planning for cath on Monday   Uncontrolled type 2 diabetes mellitus with hyperglycemia, without long-term current use of insulin  Received IV insulin  and fluids in the ED Labs not suggestive of DKA or hyperosmolar state Started on basal insulin  Sliding scale coverage Follow A1c   Intermittent claudication / Left SFA stenosis / HLD Has seen vascular in the past March 2025 L SFA stenosis of 50-75%, R ABI 0.91 with TP 58 and L ABI 0.8 with TP 52  Continue aspirin  and rosuvastatin     Chronic obstructive pulmonary disease (COPD) with ongoing tobacco use Cessation encouraged Evaluated by pulmonology in July 2025 Continue Advair (Breo per formulary), SIngulair  Albuterol  nebs as needed  HTN Continue losartan  Likely to need beta-blocker Hold HCTZ Nicotine  patch provided   Obstructive sleep apnea pending CPAP at home Will order CPAP while here   Obesity, Class III, BMI 40-49.9 (morbid obesity) Body mass index is 45.19 kg/m.SABRA  Weight loss should be encouraged on an ongoing basis Hold Ozempic for now Outpatient  PCP/bariatric medicine f/u encouraged Significantly low or high BMI is associated with higher medical risk including morbidity and mortality    Substance abuse  UDS + cocaine Extensive counseling on need for complete abstinence provided at the time of admission Patient seems very motivated to quit      Consultants: Cardiology  Procedures: Echocardiogram 8/16  Antibiotics: None      Subjective: Patient feeling better today, eager to get in a room.  Physical Exam: Vitals:   03/06/24 0900 03/06/24 0930 03/06/24 1000 03/06/24 1141  BP: (!) 114/58 127/68 (!) 131/51 135/79  Pulse: 83 81 85 83  Resp: 18 16 17 17   Temp:    97.9 F (36.6 C)  TempSrc:    Oral  SpO2: 100% 99% 99% 97%  Weight:      Height:        Intake/Output Summary (Last 24 hours) at 03/06/2024 1512 Last data filed at 03/06/2024 0328 Gross per 24 hour  Intake 1000 ml  Output --  Net 1000 ml   Filed Weights   03/05/24 2333 03/05/24 2343  Weight: (!) 138.8 kg (!) 138.8 kg    Exam:  General:  Appears calm and comfortable and is in NAD Eyes:  normal lids, iris ENT:  grossly normal hearing, lips & tongue, mmm Cardiovascular:  RRR, no m/r/g. No LE edema.  Respiratory:   CTA bilaterally with no wheezes/rales/rhonchi.  Normal respiratory effort. Abdomen:  soft, NT, ND Skin:  no rash or induration seen on limited exam Musculoskeletal:  grossly normal tone BUE/BLE, good ROM, no bony abnormality Psychiatric:  grossly normal mood and affect,  speech fluent and appropriate, AOx3 Neurologic:  CN 2-12 grossly intact, moves all extremities in coordinated fashion  Data Reviewed: I have reviewed the patient's lab results since admission.  Pertinent labs for today include:   HS troponin 317, 392 UDS cocaine    Family Communication: Wife was present throughout evaluation  Disposition: Status is: Observation The patient remains OBS appropriate and will d/c before 2 midnights.  Planned Discharge  Destination: Home    Time spent: 50 minutes  Author: Delon Herald, MD 03/06/2024 3:12 PM  For on call review [email or web address].

## 2024-03-06 NOTE — Progress Notes (Signed)
 PHARMACY - ANTICOAGULATION CONSULT NOTE  Pharmacy Consult for Heparin   Indication: chest pain/ACS  Allergies  Allergen Reactions   Metformin Diarrhea    Patient Measurements: Height: 5' 9 (175.3 cm) Weight: (!) 138.8 kg (306 lb) IBW/kg (Calculated) : 70.7 Heparin  DW = 103.5 kg   Vital Signs: Temp: 97.9 F (36.6 C) (08/16 1141) Temp Source: Oral (08/16 1141) BP: 135/79 (08/16 1141) Pulse Rate: 83 (08/16 1141)  Labs: Recent Labs    03/05/24 2345 03/06/24 0248 03/06/24 0401 03/06/24 1050  HGB 13.3  --   --  13.1  HCT 39.3  --   --  40.1  PLT 232  --   --  233  APTT  --   --  27  --   HEPARINUNFRC  --   --   --  0.18*  CREATININE 1.56*  --   --  1.23  TROPONINIHS 317* 392*  --   --     Estimated Creatinine Clearance: 86.2 mL/min (by C-G formula based on SCr of 1.23 mg/dL).   Medical History: Past Medical History:  Diagnosis Date   Hypertension     Medications:   Assessment: Pharmacy consulted to dose heparin  in this 63 year old male admitted with ACS/NSTEMI.  No prior anticoag noted.  CrCl = 68 ml/min   8/16 1050 HL 0.18,  SUBtherapeutic  Goal of Therapy:  Heparin  level 0.3-0.7 units/ml Monitor platelets by anticoagulation protocol: Yes   Plan:  8/16 1050 HL 0.18,  SUBtherapeutic Will order bolus of 3000 units x1 Increase heparin  infusion to 1700 units/hr Check anti-Xa level in 6 hours and daily while on heparin  Continue to monitor H&H and platelets  Allean Haas PharmD Clinical Pharmacist 03/06/2024

## 2024-03-06 NOTE — Assessment & Plan Note (Signed)
 Extensive counseling on need for complete abstinence Patient seems very motivated to quit

## 2024-03-06 NOTE — Progress Notes (Signed)
 PHARMACY - ANTICOAGULATION CONSULT NOTE  Pharmacy Consult for Heparin   Indication: chest pain/ACS  Allergies  Allergen Reactions   Metformin Diarrhea    Patient Measurements: Height: 5' 9 (175.3 cm) Weight: (!) 138.8 kg (306 lb) IBW/kg (Calculated) : 70.7 Heparin  DW = 103.5 kg   Vital Signs: Temp: 97.5 F (36.4 C) (08/15 2331) Temp Source: Oral (08/15 2331) BP: 145/79 (08/15 2331) Pulse Rate: 109 (08/15 2331)  Labs: Recent Labs    03/05/24 2345  HGB 13.3  HCT 39.3  PLT 232  CREATININE 1.56*  TROPONINIHS 317*    Estimated Creatinine Clearance: 68 mL/min (A) (by C-G formula based on SCr of 1.56 mg/dL (H)).   Medical History: Past Medical History:  Diagnosis Date   Hypertension     Medications:   Assessment: Pharmacy consulted to dose heparin  in this 63 year old male admitted with ACS/NSTEMI.  No prior anticoag noted.  CrCl = 68 ml/min   Goal of Therapy:  Heparin  level 0.3-0.7 units/ml Monitor platelets by anticoagulation protocol: Yes   Plan:  Give 4000 units bolus x 1 Start heparin  infusion at 1450 units/hr Check anti-Xa level in 6 hours and daily while on heparin  Continue to monitor H&H and platelets  Chou Busler D 03/06/2024,3:26 AM

## 2024-03-06 NOTE — Consult Note (Signed)
 CARDIOLOGY CONSULT NOTE               Patient ID: Greg Hernandez MRN: [id number] DOB/AGE: June 19, 1961 63 y.o.  Admit date: 03/05/2024 Referring Physician Dr. Delayne Solian hospitalist Primary Physician Ozell Burnard [place], [place] Primary Cardiologist [facility] cardiology Reason for Consultation chest pain shortness of breath  HPI: 63 year old male multiple medical problems presented with elevated blood sugars over 600 history of hypertension peripheral vascular disease hyperlipidemia COPD obesity smoking occasional substance abuse also complained of significant chest pain.  Patient describes midsternal chest discomfort.  Has had limited cardiac workup in the past but significant peripheral vascular disease evaluation presented with possible UTI.  Patient has history of tobacco abuse found to have mildly elevated troponins so cardiology was consulted for further evaluation  Review of systems complete and found to be negative unless listed above     Past Medical History:  Diagnosis Date   Hypertension     Past Surgical History:  Procedure Laterality Date   COLONOSCOPY WITH PROPOFOL  N/A 10/10/2023   Procedure: COLONOSCOPY WITH PROPOFOL ;  Surgeon: Unk Corinn Skiff, MD;  Location: [facility] ENDOSCOPY;  Service: Gastroenterology;  Laterality: N/A;   POLYPECTOMY  10/10/2023   Procedure: POLYPECTOMY;  Surgeon: Unk Corinn Skiff, MD;  Location: [facility] ENDOSCOPY;  Service: Gastroenterology;;    (Not in a hospital admission)  Social History   Socioeconomic History   Marital status: Single    Spouse name: Not on file   Number of children: Not on file   Years of education: Not on file   Highest education level: Not on file  Occupational History   Not on file  Tobacco Use   Smoking status: Every Day    Types: Cigarettes   Smokeless tobacco: Never  Vaping Use   Vaping status: Never Used  Substance and Sexual Activity   Alcohol use: Yes   Drug use: Yes    Types: Cocaine    Comment:  wednesday   Sexual activity: Not on file  Other Topics Concern   Not on file  Social History Narrative   Not on file   Social Drivers of Health   Financial Resource Strain: Low Risk  (12/02/2023)   Received from [facility]   Overall Financial Resource Strain (CARDIA)    Difficulty of Paying Living Expenses: Not hard at all  Food Insecurity: No Food Insecurity (12/02/2023)   Received from [facility]   Hunger Vital Sign    Within the past 12 months, you worried that your food would run out before you got the money to buy more.: Never true    Within the past 12 months, the food you bought just didn't last and you didn't have money to get more.: Never true  Transportation Needs: No Transportation Needs (12/02/2023)   Received from [facility] - Transportation    In the past 12 months, has lack of transportation kept you from medical appointments or from getting medications?: No    Lack of Transportation (Non-Medical): No  Physical Activity: Sufficiently Active (06/30/2023)   Received from [facility]   Exercise Vital Sign    On average, how many days per week do you engage in moderate to strenuous exercise (like a brisk walk)?: 3 days    On average, how many minutes do you engage in exercise at this level?: 120 min  Stress: No Stress Concern Present (06/30/2023)   Received from [facility]   [employer] of Occupational Health - Occupational Stress Questionnaire    Feeling of Stress : Not at all  Social Connections: Socially Integrated (06/30/2023)   Received from [facility]   Social Connection and Isolation Panel    In a typical week, how many times do you talk on the phone with family, friends, or neighbors?: More than three times a week    How often do you get together with friends or relatives?: Three times a week    How often do you attend church or religious  services?: More than 4 times per year    Do you belong to any clubs or organizations such as church groups, unions, fraternal or athletic groups, or school groups?: No    How often do you attend meetings of the clubs or organizations you belong to?: More than 4 times per year    Are you married, widowed, divorced, separated, never married, or living with a partner?: Married  Intimate Partner Violence: Not on file    History reviewed. No pertinent family history.    Review of systems complete and found to be negative unless listed above      PHYSICAL EXAM  General: Well developed, well nourished, in no acute distress HEENT:  Normocephalic and atramatic Neck:  No JVD.  Lungs: Clear bilaterally to auscultation and percussion. Heart: HRRR . Normal S1 and S2 without gallops or murmurs.  Abdomen: Bowel sounds are positive, abdomen soft and non-tender  Msk:  Back normal, normal gait. Normal strength and tone for age. Extremities: No clubbing, cyanosis or edema.   Neuro: Alert and oriented X 3. Psych:  Good affect, responds appropriately  Labs:   Lab Results  Component Value Date   WBC 7.1 03/06/2024   HGB 13.1 03/06/2024   HCT 40.1 03/06/2024   MCV 85.0 03/06/2024   PLT 233 03/06/2024    Recent Labs  Lab 03/06/24 1050  NA 136  K 3.9  CL 103  CO2 25  BUN 19  CREATININE 1.23  CALCIUM  9.1  GLUCOSE 184*   No results found for: CKTOTAL, CKMB, CKMBINDEX, TROPONINI No results found for: CHOL No results found for: HDL No results found for: LDLCALC No results found for: TRIG No results found for: CHOLHDL No results found for: LDLDIRECT    Radiology: ECHOCARDIOGRAM COMPLETE Result Date: 03/06/2024    ECHOCARDIOGRAM REPORT   Patient Name:   Greg Hernandez Date of Exam: 03/06/2024 Medical Rec #:  [id number]        Height:       69.0 in Accession #:    [id number]       Weight:       306.0 lb Date of Birth:  08-12-1960         BSA:          2.475 m Patient Age:     63 years         BP:           129/72 mmHg Patient Gender: M                HR:           86 bpm. Exam Location:  [facility] Procedure: 2D Echo, Cardiac Doppler, Color Doppler and Intracardiac            Opacification Agent (Both Spectral and Color Flow Doppler were            utilized during procedure). Indications:  NSTEMI I21.4  History:         Patient has no prior history of Echocardiogram examinations.                  Risk Factors:Hypertension.  Sonographer:     Bari Roar Referring Phys:  [id number] DELAYNE LULLA SOLIAN Diagnosing Phys: Cara JONETTA Lovelace MD IMPRESSIONS  1. Inferior/posterior hypo.  2. Left ventricular ejection fraction, by estimation, is 35 to 40%. The left ventricle has moderately decreased function. The left ventricle demonstrates regional wall motion abnormalities (see scoring diagram/findings for description). The left ventricular internal cavity size was mildly to moderately dilated. There is mild concentric left ventricular hypertrophy. Left ventricular diastolic parameters are consistent with Grade I diastolic dysfunction (impaired relaxation).  3. Right ventricular systolic function is normal. The right ventricular size is normal.  4. The mitral valve is normal in structure. No evidence of mitral valve regurgitation.  5. The aortic valve is normal in structure. Aortic valve regurgitation is not visualized. FINDINGS  Left Ventricle: Left ventricular ejection fraction, by estimation, is 35 to 40%. The left ventricle has moderately decreased function. The left ventricle demonstrates regional wall motion abnormalities. Definity  contrast agent was given IV to delineate the left ventricular endocardial borders. Strain was performed and the global longitudinal strain is indeterminate. Global longitudinal strain performed but not reported based on interpreter judgement due to suboptimal tracking. The left ventricular internal cavity size was mildly to moderately dilated. There is mild concentric  left ventricular hypertrophy. Left ventricular diastolic parameters are consistent with Grade I diastolic dysfunction (impaired relaxation). Right Ventricle: The right ventricular size is normal. No increase in right ventricular wall thickness. Right ventricular systolic function is normal. Left Atrium: Left atrial size was normal in size. Right Atrium: Right atrial size was normal in size. Pericardium: There is no evidence of pericardial effusion. Mitral Valve: The mitral valve is normal in structure. No evidence of mitral valve regurgitation. MV peak gradient, 3.2 mmHg. The mean mitral valve gradient is 2.0 mmHg. Tricuspid Valve: The tricuspid valve is normal in structure. Tricuspid valve regurgitation is not demonstrated. Aortic Valve: The aortic valve is normal in structure. Aortic valve regurgitation is not visualized. Aortic valve mean gradient measures 4.0 mmHg. Aortic valve peak gradient measures 9.1 mmHg. Aortic valve area, by VTI measures 2.28 cm. Pulmonic Valve: The pulmonic valve was normal in structure. Pulmonic valve regurgitation is not visualized. Aorta: The ascending aorta was not well visualized. IAS/Shunts: No atrial level shunt detected by color flow Doppler. Additional Comments: Inferior/posterior hypo. 3D was performed not requiring image post processing on an independent workstation and was indeterminate.  LEFT VENTRICLE PLAX 2D LVIDd:         5.50 cm      Diastology LVIDs:         4.70 cm      LV e' medial:    7.51 cm/s LV PW:         1.20 cm      LV E/e' medial:  14.1 LV IVS:        1.30 cm      LV e' lateral:   6.31 cm/s LVOT diam:     2.00 cm      LV E/e' lateral: 16.8 LV SV:         60 LV SV Index:   24 LVOT Area:     3.14 cm  LV Volumes (MOD) LV vol d, MOD A2C: 149.0 ml LV vol d, MOD A4C: 141.0  ml LV vol s, MOD A2C: 97.1 ml LV vol s, MOD A4C: 90.1 ml LV SV MOD A2C:     51.9 ml LV SV MOD A4C:     141.0 ml LV SV MOD BP:      55.0 ml RIGHT VENTRICLE RV Basal diam:  3.00 cm RV Mid diam:     2.80 cm RV S prime:     13.70 cm/s TAPSE (M-mode): 2.1 cm LEFT ATRIUM             Index        RIGHT ATRIUM           Index LA diam:        3.90 cm 1.58 cm/m   RA Area:     14.30 cm LA Vol (A2C):   46.3 ml 18.70 ml/m  RA Volume:   34.00 ml  13.74 ml/m LA Vol (A4C):   47.1 ml 19.03 ml/m LA Biplane Vol: 47.6 ml 19.23 ml/m  AORTIC VALVE                    PULMONIC VALVE AV Area (Vmax):    2.01 cm     PV Vmax:        1.09 m/s AV Area (Vmean):   1.90 cm     PV Peak grad:   4.8 mmHg AV Area (VTI):     2.28 cm     RVOT Peak grad: 2 mmHg AV Vmax:           151.00 cm/s AV Vmean:          93.800 cm/s AV VTI:            0.262 m AV Peak Grad:      9.1 mmHg AV Mean Grad:      4.0 mmHg LVOT Vmax:         96.60 cm/s LVOT Vmean:        56.700 cm/s LVOT VTI:          0.190 m LVOT/AV VTI ratio: 0.73  AORTA Ao Root diam: 2.60 cm Ao Asc diam:  2.80 cm MITRAL VALVE                TRICUSPID VALVE MV Area (PHT): 3.56 cm     TR Peak grad:   19.5 mmHg MV Area VTI:   2.62 cm     TR Vmax:        221.00 cm/s MV Peak grad:  3.2 mmHg MV Mean grad:  2.0 mmHg     SHUNTS MV Vmax:       0.90 m/s     Systemic VTI:  0.19 m MV Vmean:      62.7 cm/s    Systemic Diam: 2.00 cm MV Decel Time: 213 msec MV E velocity: 106.00 cm/s MV A velocity: 114.00 cm/s MV E/A ratio:  0.93 MV A Prime:    12.6 cm/s Cara JONETTA Lovelace MD Electronically signed by Cara JONETTA Lovelace MD Signature Date/Time: 03/06/2024/1:10:21 PM    Final    DG Chest Portable 1 View Result Date: 03/06/2024 CLINICAL DATA:  Chest pain leg edema EXAM: PORTABLE CHEST 1 VIEW COMPARISON:  07/24/2011 FINDINGS: Mild cardiomegaly. No acute airspace disease, pleural effusion or pneumothorax IMPRESSION: No active disease. Mild cardiomegaly. Electronically Signed   By: Luke Bun M.D.   On: 03/06/2024 01:08    EKG: Normal sinus rhythm rate of 90 nonspecific ST-T wave changes  ASSESSMENT AND PLAN:  Chest  pain possibly angina Hypertension Peripheral vascular  disease Hyperlipidemia Obesity Probable obstructive sleep apnea COPD New onset diabetes QT prolongation Elevated troponin Mild cardiomyopathy . Plan Agree with admit to telemetry follow-up EKGs follow-up troponins IV heparin  anticoagulation for 48 to 72 hours Hypertension management and control Peripheral vascular disease by history continue aspirin  and statin blood pressure control walking exercise Inhalers supplemental oxygen as necessary for COPD Recommend sleep study CPAP weight loss Recommend diabetes management and control Advised patient refrain from tobacco abuse Recommend GDMT for cardiomyopathy ACE ARB or Arni beta-blocker spironolactone  SGLT2 Renal insufficiency mild related to dehydration follow-up elevated blood sugar recommend hydration Consider functional study versus cardiac cath on Monday   Signed: Cara JONETTA Lovelace MD 03/06/2024, 5:17 PM

## 2024-03-06 NOTE — Progress Notes (Signed)
 PHARMACY - ANTICOAGULATION CONSULT NOTE  Pharmacy Consult for Heparin   Indication: chest pain/ACS  Allergies  Allergen Reactions   Metformin Diarrhea    Patient Measurements: Height: 5' 9 (175.3 cm) Weight: (!) 138.8 kg (306 lb) IBW/kg (Calculated) : 70.7 HEPARIN  DW (KG): 103.5 Heparin  DW = 103.5 kg   Vital Signs: Temp: 98.1 F (36.7 C) (08/16 1600) Temp Source: Oral (08/16 1600) BP: 126/59 (08/16 1700) Pulse Rate: 88 (08/16 1700)  Labs: Recent Labs    03/05/24 2345 03/06/24 0248 03/06/24 0401 03/06/24 1050 03/06/24 1717  HGB 13.3  --   --  13.1  --   HCT 39.3  --   --  40.1  --   PLT 232  --   --  233  --   APTT  --   --  27  --   --   HEPARINUNFRC  --   --   --  0.18* <0.10*  CREATININE 1.56*  --   --  1.23  --   TROPONINIHS 317* 392*  --   --   --     Estimated Creatinine Clearance: 86.2 mL/min (by C-G formula based on SCr of 1.23 mg/dL).   Medical History: Past Medical History:  Diagnosis Date   Hypertension     Medications:   Assessment: Pharmacy consulted to dose heparin  in this 63 year old male admitted with ACS/NSTEMI.  No prior anticoag noted.  CrCl = 68 ml/min   8/16 1050 HL 0.18,  SUBtherapeutic 8/16 1717 HL <0.10, SUBtherapeutic  Goal of Therapy:  Heparin  level 0.3-0.7 units/ml Monitor platelets by anticoagulation protocol: Yes   Plan:  Will order bolus of 3100 units x1 Increase heparin  infusion to 2000 units/hr Check anti-Xa level in 6 hours and daily while on heparin  Continue to monitor H&H and platelets  Thank you for involving pharmacy in this patient's care.   Damien Napoleon, PharmD Clinical Pharmacist 03/06/2024 7:19 PM

## 2024-03-06 NOTE — Assessment & Plan Note (Signed)
 Complicating factor to overall prognosis and care

## 2024-03-06 NOTE — Assessment & Plan Note (Signed)
 Dyspnea on exertion Heparin  infusion Aspirin ,  continue home rosuvastatin  and losartan  Will hold beta-blocker for now given substance use-pending cardiology recommendations Nitroglycerin  sublingual as needed chest pain with morphine  for breakthrough Echocardiogram to evaluate for segmental wall motion abnormality and left ventricular systolic function Cardiology consulted

## 2024-03-06 NOTE — Assessment & Plan Note (Signed)
 Received IV insulin  and fluids in the ED Labs not suggestive of DKA or hyperosmolar state We will start basal insulin  Sliding scale coverage Follow A1c

## 2024-03-06 NOTE — Assessment & Plan Note (Addendum)
 HLD Tobacco use disorder Has seen vascular in the past March 2025 L SFA stenosis of 50-75%, R ABI 0.91 with TP 58 and L ABI 0.8 with TP 52  Continue aspirin  and statin Nicotine  patch Extensive counseling on need for lifestyle modification.  Wife at bedside reinforcing

## 2024-03-06 NOTE — ED Provider Notes (Signed)
 [facility] Provider Note    Event Date/Time   First MD Initiated Contact with Patient 03/05/24 2358     (approximate)   History   Hyperglycemia  Pt. In via POV from home, reports BSG 595 at home, has been running high for 1 week, HX: hypertension, hyperlipidemia, kidney disease, T2DM  States he smoked crack Thursday   HPI Greg Hernandez is a 63 y.o. male PMH diabetes, COPD, hypertension, hyperlipidemia presents for evaluation of hyperglycemia, urinary frequency, bilateral lower extremity edema, fatigue, chest pain - Patient states that his blood sugars have been in the 500s over the past week or so.  Appears not to be on any outpatient medications for his diabetes.  He is on Ozempic. -Not currently on steroids -Separately notes that over the past 2 weeks or so he has been having exertional substernal chest pain, lasts about 10 minutes and then resolves. -Also notes he has been having worsening bilateral lower extremity edema over the past 1-2 weeks with some associated paresthesias in bilateral legs     Physical Exam   Triage Vital Signs: ED Triage Vitals  Encounter Vitals Group     BP 03/05/24 2331 (!) 145/79     Girls Systolic BP Percentile --      Girls Diastolic BP Percentile --      Boys Systolic BP Percentile --      Boys Diastolic BP Percentile --      Pulse Rate 03/05/24 2331 (!) 109     Resp 03/05/24 2331 19     Temp 03/05/24 2331 (!) 97.5 F (36.4 C)     Temp Source 03/05/24 2331 Oral     SpO2 03/05/24 2331 97 %     Weight 03/05/24 2333 (!) 306 lb (138.8 kg)     Height 03/05/24 2343 5' 9 (1.753 m)     Head Circumference --      Peak Flow --      Pain Score 03/05/24 2343 0     Pain Loc --      Pain Education --      Exclude from Growth Chart --     Most recent vital signs: Vitals:   03/05/24 2331  BP: (!) 145/79  Pulse: (!) 109  Resp: 19  Temp: (!) 97.5 F (36.4 C)  SpO2: 97%     General: Awake, no distress.   CV:  Good peripheral perfusion. RRR, RP 2+ Resp:  Normal effort. CTAB Abd:  No distention. Nontender to deep palpation throughout Other:  1+ pitting edema to bilateral lower extremities   ED Results / Procedures / Treatments   Labs (all labs ordered are listed, but only abnormal results are displayed) Labs Reviewed  BASIC METABOLIC PANEL WITH GFR - Abnormal; Notable for the following components:      Result Value   Sodium 130 (*)    Chloride 97 (*)    Glucose, Bld 579 (*)    BUN 25 (*)    Creatinine, Ser 1.56 (*)    GFR, Estimated 50 (*)    All other components within normal limits  URINALYSIS, ROUTINE W REFLEX MICROSCOPIC - Abnormal; Notable for the following components:   Color, Urine COLORLESS (*)    APPearance CLEAR (*)    Glucose, UA >=500 (*)    Ketones, ur 5 (*)    Leukocytes,Ua SMALL (*)    Bacteria, UA RARE (*)    All other components within normal limits  BLOOD GAS, VENOUS -  Abnormal; Notable for the following components:   pH, Ven 7.44 (*)    pCO2, Ven 43 (*)    pO2, Ven 68 (*)    Bicarbonate 29.2 (*)    Acid-Base Excess 4.4 (*)    All other components within normal limits  BETA-HYDROXYBUTYRIC ACID - Abnormal; Notable for the following components:   Beta-Hydroxybutyric Acid 0.44 (*)    All other components within normal limits  CBG MONITORING, ED - Abnormal; Notable for the following components:   Glucose-Capillary >600 (*)    All other components within normal limits  TROPONIN I (HIGH SENSITIVITY) - Abnormal; Notable for the following components:   Troponin I (High Sensitivity) 317 (*)    All other components within normal limits  CBC  BRAIN NATRIURETIC PEPTIDE  OSMOLALITY, URINE  OSMOLALITY  HEMOGLOBIN A1C  CBG MONITORING, ED  CBG MONITORING, ED  TROPONIN I (HIGH SENSITIVITY)     EKG  Ecg = sinus rhythm, rate 97, no gross ST elevation or depression, significant repolarization abnormality, normal axis. No clear evidence of ischemia on my  interpretation.  QTc mildly prolonged at 507.   RADIOLOGY As interpreted by myself and radiology report reviewed.  No acute pathology identified.    PROCEDURES:  Critical Care performed: Yes, see critical care procedure note(s)  .Critical Care  Performed by: Clarine Ozell LABOR, MD Authorized by: Clarine Ozell LABOR, MD   Critical care provider statement:    Critical care time (minutes):  30   Critical care time was exclusive of:  Separately billable procedures and treating other patients   Critical care was necessary to treat or prevent imminent or life-threatening deterioration of the following conditions:  Cardiac failure   Critical care was time spent personally by me on the following activities:  Development of treatment plan with patient or surrogate, discussions with consultants, evaluation of patient's response to treatment, examination of patient, ordering and review of laboratory studies, ordering and review of radiographic studies, ordering and performing treatments and interventions, pulse oximetry, re-evaluation of patient's condition and review of old charts   I assumed direction of critical care for this patient from another provider in my specialty: no     Care discussed with: admitting provider      MEDICATIONS ORDERED IN ED: Medications  aspirin  chewable tablet 324 mg (has no administration in time range)  cefTRIAXone  (ROCEPHIN ) 1 g in sodium chloride  0.9 % 100 mL IVPB (0 g Intravenous Stopped 03/06/24 0215)  insulin  aspart (novoLOG ) injection 10 Units (10 Units Intravenous Given 03/06/24 0139)  sodium chloride  0.9 % bolus 1,000 mL (1,000 mLs Intravenous New Bag/Given 03/06/24 0138)     IMPRESSION / MDM / ASSESSMENT AND PLAN / ED COURSE  I reviewed the triage vital signs and the nursing notes.                              DDX/MDM/AP: Differential diagnosis includes, but is not limited to, DKA/HHS, consider underlying infection-suspicion for possible UTI.  Do not  clinically suspect pneumonia or intra-abdominal pathology at this time.  No other clear precipitant of worsened hyperglycemia at this time, given notable elevation and not on any regimen currently I do believe patient would benefit from admission.  Regarding his chest discomfort, consider possibility of ACS.  Do not clinically suspect DVT/PE at this time.  Consider CHF.  Plan: - Labs - EKG - Chest x-ray - Small bolus of fluid, avoid early aggressive fluid  resuscitation given possibility of underlying CHF - Anticipate need for insulin  - Anticipate admission  Patient's presentation is most consistent with acute presentation with potential threat to life or bodily function.  The patient is on the cardiac monitor to evaluate for evidence of arrhythmia and/or significant heart rate changes.  ED course below.  Treating hyperglycemia with insulin  and IV fluids, also found to have UTI, treating with ceftriaxone .  Cardiac workup notable for elevated troponin to 300s, no active chest pain at this time.  Treating with heparin  drip and aspirin .  Patient does endorse to RN that he sometimes uses cocaine, may be contributing.  EKG nonischemic on my read.  Admitted to hospitalist service.  Clinical Course as of 03/06/24 0319  Sat Mar 06, 2024  0047 BMP with notable hyperglycemia at 579, fortunately normal anion gap.  Mildly elevated creatinine at 1.56 appears to be at baseline from recent outpatient labs in May of this year [MM]  0048 CBC unremarkable  Urinalysis consistent with infection [MM]  0048 VBG with no alkalosis [MM]  0229 Bnp wnl [MM]  0307 Troponin elevated at 317  Will give aspirin  and start heparin  given the degree of elevation in the setting of recent episodes of chest pain [MM]  0318 Chest x-ray with mild cardiomegaly, otherwise unremarkable my interpretation.  Radiology report reviewed. [MM]    Clinical Course User Index [MM] Clarine Ozell LABOR, MD     FINAL CLINICAL IMPRESSION(S) /  ED DIAGNOSES   Final diagnoses:  Hyperglycemia  Acute cystitis without hematuria  Chest pain, unspecified type  NSTEMI (non-ST elevated myocardial infarction) [facility])     Rx / DC Orders   ED Discharge Orders     None        Note:  This document was prepared using Dragon voice recognition software and may include unintentional dictation errors.   Clarine Ozell LABOR, MD 03/06/24 [phone]

## 2024-03-06 NOTE — Hospital Course (Signed)
 Greg Hernandez

## 2024-03-06 NOTE — Progress Notes (Signed)
*  PRELIMINARY RESULTS* Echocardiogram 2D Echocardiogram has been performed.  Torrey Horseman C Leroy Pettway 03/06/2024, 10:05 AM

## 2024-03-06 NOTE — Assessment & Plan Note (Signed)
 Obstructive sleep apnea--pending  CPAP Tobacco use disorder Evaluated by pulmonology in July 2025 Continue home inhalers DuoNebs as needed Nicotine  patch

## 2024-03-06 NOTE — H&P (Addendum)
 History and Physical    Patient: Greg Hernandez FMW:[id number] DOB: 1961/01/05 DOA: 03/05/2024 DOS: the patient was seen and examined on 03/06/2024 PCP: Perri Constance Sor, PA-C  Patient coming from: Home  Chief Complaint:  Chief Complaint  Patient presents with   Hyperglycemia    HPI: Greg Hernandez is a 63 y.o. male with medical history significant for HTN, PAD, HLD, COPD, morbid obesity, diabetes, tobacco use disorder,  occasional crack use being admitted for unstable angina/high risk chest pain and hyperglycemia.  Patient reports exertional nonradiating chest pain, that is relieved with rest.  Also has dyspnea on exertion.  He presented tonight due to concern for persistent chest pain and shortness of breath with exertion and additionally because his blood sugar reading was 600 at home.  Of note, he was seen by [facility] cardiology in May 2025 however no workup was planned.  He saw pulmonology July 2025 with new COPD and OSA diagnosis and is currently awaiting CPAP. In the ED, mildly tachycardic to 109 with otherwise normal vitals. Troponin 317 and BNP 9.6 Glucose 579 with normal anion gap, venous pH 7.44 and beta hydroxy 0.44 Urine osmolality 547, serum osmolality pending CBC WNL Urine showed small leuks and rare bacteria EKG Showing sinus at 97 with QTc of 547 Chest x-ray with mild cardiomegaly otherwise nonacute Patient treated with an NS bolus, regular insulin  Aspirin  325 ordered and heparin  ordered Also given ceftriaxone  for possible UTI  Admission requested     Review of Systems: As mentioned in the history of present illness. All other systems reviewed and are negative.  Past Medical History:  Diagnosis Date   Hypertension    Past Surgical History:  Procedure Laterality Date   COLONOSCOPY WITH PROPOFOL  N/A 10/10/2023   Procedure: COLONOSCOPY WITH PROPOFOL ;  Surgeon: Unk Corinn Skiff, MD;  Location: [facility] ENDOSCOPY;  Service: Gastroenterology;  Laterality: N/A;    POLYPECTOMY  10/10/2023   Procedure: POLYPECTOMY;  Surgeon: Unk Corinn Skiff, MD;  Location: [facility] ENDOSCOPY;  Service: Gastroenterology;;   Social History:  reports that he has been smoking cigarettes. He has never used smokeless tobacco. He reports current alcohol use. He reports current drug use. Drug: Cocaine.  Allergies  Allergen Reactions   Metformin Diarrhea    History reviewed. No pertinent family history.  Prior to Admission medications   Medication Sig Start Date End Date Taking? Authorizing Provider  aspirin  EC 81 MG tablet Take 81 mg by mouth daily.    [provider]  hydrochlorothiazide (HYDRODIURIL) 25 MG tablet Take 1 tablet by mouth daily. 08/06/22 05/04/24  [provider]  losartan  (COZAAR ) 100 MG tablet Take 1 tablet by mouth daily. 05/05/23 05/21/24  [provider]  nicotine  polacrilex (NICORETTE) 2 MG gum Take 2 mg by mouth as needed. 05/27/23   [provider]  rosuvastatin  (CRESTOR ) 40 MG tablet Take 1 tablet by mouth daily. 05/27/23 05/26/24  [provider]    Physical Exam: Vitals:   03/05/24 2331 03/05/24 2333 03/05/24 2343  BP: (!) 145/79    Pulse: (!) 109    Resp: 19    Temp: (!) 97.5 F (36.4 C)    TempSrc: Oral    SpO2: 97%    Weight:  (!) 138.8 kg   Height:   5' 9 (1.753 m)   Physical Exam Vitals and nursing note reviewed.  Constitutional:      General: He is not in acute distress.    Appearance: He is morbidly obese.  Comments: Conversational dyspnea  HENT:     Head: Normocephalic and atraumatic.  Cardiovascular:     Rate and Rhythm: Regular rhythm. Tachycardia present.     Heart sounds: Normal heart sounds.  Pulmonary:     Effort: Pulmonary effort is normal.     Breath sounds: Normal breath sounds.     Comments: Diminished without wheezing Abdominal:     Palpations: Abdomen is soft.     Tenderness: There is no abdominal tenderness.  Neurological:     Mental Status: Mental status is  at baseline.     Labs on Admission: I have personally reviewed following labs and imaging studies  CBC: Recent Labs  Lab 03/05/24 2345  WBC 7.7  HGB 13.3  HCT 39.3  MCV 84.0  PLT 232   Basic Metabolic Panel: Recent Labs  Lab 03/05/24 2345  NA 130*  K 4.1  CL 97*  CO2 25  GLUCOSE 579*  BUN 25*  CREATININE 1.56*  CALCIUM  9.4   GFR: Estimated Creatinine Clearance: 68 mL/min (A) (by C-G formula based on SCr of 1.56 mg/dL (H)). Liver Function Tests: No results for input(s): AST, ALT, ALKPHOS, BILITOT, PROT, ALBUMIN in the last 168 hours. No results for input(s): LIPASE, AMYLASE in the last 168 hours. No results for input(s): AMMONIA in the last 168 hours. Coagulation Profile: No results for input(s): INR, PROTIME in the last 168 hours. Cardiac Enzymes: No results for input(s): CKTOTAL, CKMB, CKMBINDEX, TROPONINI in the last 168 hours. BNP (last 3 results) No results for input(s): PROBNP in the last 8760 hours. HbA1C: No results for input(s): HGBA1C in the last 72 hours. CBG: Recent Labs  Lab 03/05/24 2328  GLUCAP >600*   Lipid Profile: No results for input(s): CHOL, HDL, LDLCALC, TRIG, CHOLHDL, LDLDIRECT in the last 72 hours. Thyroid Function Tests: No results for input(s): TSH, T4TOTAL, FREET4, T3FREE, THYROIDAB in the last 72 hours. Anemia Panel: No results for input(s): VITAMINB12, FOLATE, FERRITIN, TIBC, IRON, RETICCTPCT in the last 72 hours. Urine analysis:    Component Value Date/Time   COLORURINE COLORLESS (A) 03/06/2024 0011   APPEARANCEUR CLEAR (A) 03/06/2024 0011   APPEARANCEUR Hazy 07/24/2011 1440   LABSPEC 1.023 03/06/2024 0011   LABSPEC 1.023 07/24/2011 1440   PHURINE 7.0 03/06/2024 0011   GLUCOSEU >=500 (A) 03/06/2024 0011   GLUCOSEU Negative 07/24/2011 1440   HGBUR NEGATIVE 03/06/2024 0011   BILIRUBINUR NEGATIVE 03/06/2024 0011   BILIRUBINUR Negative 07/24/2011 1440    KETONESUR 5 (A) 03/06/2024 0011   PROTEINUR NEGATIVE 03/06/2024 0011   NITRITE NEGATIVE 03/06/2024 0011   LEUKOCYTESUR SMALL (A) 03/06/2024 0011   LEUKOCYTESUR 1+ 07/24/2011 1440    Radiological Exams on Admission: DG Chest Portable 1 View Result Date: 03/06/2024 CLINICAL DATA:  Chest pain leg edema EXAM: PORTABLE CHEST 1 VIEW COMPARISON:  07/24/2011 FINDINGS: Mild cardiomegaly. No acute airspace disease, pleural effusion or pneumothorax IMPRESSION: No active disease. Mild cardiomegaly. Electronically Signed   By: Luke Bun M.D.   On: 03/06/2024 01:08   Data Reviewed for HPI: Relevant notes from primary care and specialist visits, past discharge summaries as available in EHR, including Care Everywhere. Prior diagnostic testing as pertinent to current admission diagnoses Updated medications and problem lists for reconciliation ED course, including vitals, labs, imaging, treatment and response to treatment Triage notes, nursing and pharmacy notes and ED provider's notes Notable results as noted above in HPI      Assessment and Plan: * Unstable angina (HCC) Dyspnea on exertion Heparin  infusion  Aspirin ,  continue home rosuvastatin  and losartan  Will hold beta-blocker for now given substance use-pending cardiology recommendations Nitroglycerin  sublingual as needed chest pain with morphine  for breakthrough Echocardiogram to evaluate for segmental wall motion abnormality and left ventricular systolic function Cardiology consulted  Uncontrolled type 2 diabetes mellitus with hyperglycemia, without long-term current use of insulin  (HCC) Received IV insulin  and fluids in the ED Labs not suggestive of DKA or hyperosmolar state We will start basal insulin  Sliding scale coverage Follow A1c  Intermittent claudication / Left SFA stenosis  (HCC) HLD Tobacco use disorder Has seen vascular in the past March 2025 L SFA stenosis of 50-75%, R ABI 0.91 with TP 58 and L ABI 0.8 with TP 52   Continue aspirin  and statin Nicotine  patch Extensive counseling on need for lifestyle modification.  Wife at bedside reinforcing   Chronic obstructive pulmonary disease (COPD) (HCC) Obstructive sleep apnea--pending  CPAP Tobacco use disorder Evaluated by pulmonology in July 2025 Continue home inhalers DuoNebs as needed Nicotine  patch   Obesity, Class III, BMI 40-49.9 (morbid obesity) Complicating factor to overall prognosis and care  Substance abuse (HCC) Extensive counseling on need for complete abstinence Patient seems very motivated to quit        DVT prophylaxis: heparin   Consults: [facility] cardiology  Advance Care Planning: full code  Family Communication: none  Disposition Plan: Back to previous home environment  Severity of Illness: The appropriate patient status for this patient is OBSERVATION. Observation status is judged to be reasonable and necessary in order to provide the required intensity of service to ensure the patient's safety. The patient's presenting symptoms, physical exam findings, and initial radiographic and laboratory data in the context of their medical condition is felt to place them at decreased risk for further clinical deterioration. Furthermore, it is anticipated that the patient will be medically stable for discharge from the hospital within 2 midnights of admission.   Author: Delayne LULLA Solian, MD 03/06/2024 3:26 AM  For on call review [email or web address].

## 2024-03-07 DIAGNOSIS — I214 Non-ST elevation (NSTEMI) myocardial infarction: Secondary | ICD-10-CM | POA: Diagnosis present

## 2024-03-07 DIAGNOSIS — E1151 Type 2 diabetes mellitus with diabetic peripheral angiopathy without gangrene: Secondary | ICD-10-CM | POA: Diagnosis present

## 2024-03-07 DIAGNOSIS — E785 Hyperlipidemia, unspecified: Secondary | ICD-10-CM | POA: Diagnosis present

## 2024-03-07 DIAGNOSIS — F17210 Nicotine dependence, cigarettes, uncomplicated: Secondary | ICD-10-CM | POA: Diagnosis present

## 2024-03-07 DIAGNOSIS — Z7982 Long term (current) use of aspirin: Secondary | ICD-10-CM | POA: Diagnosis not present

## 2024-03-07 DIAGNOSIS — N179 Acute kidney failure, unspecified: Secondary | ICD-10-CM | POA: Diagnosis not present

## 2024-03-07 DIAGNOSIS — G4733 Obstructive sleep apnea (adult) (pediatric): Secondary | ICD-10-CM | POA: Diagnosis present

## 2024-03-07 DIAGNOSIS — E66813 Obesity, class 3: Secondary | ICD-10-CM | POA: Diagnosis present

## 2024-03-07 DIAGNOSIS — I110 Hypertensive heart disease with heart failure: Secondary | ICD-10-CM | POA: Diagnosis present

## 2024-03-07 DIAGNOSIS — I4891 Unspecified atrial fibrillation: Secondary | ICD-10-CM | POA: Diagnosis present

## 2024-03-07 DIAGNOSIS — Z79899 Other long term (current) drug therapy: Secondary | ICD-10-CM | POA: Diagnosis not present

## 2024-03-07 DIAGNOSIS — I2510 Atherosclerotic heart disease of native coronary artery without angina pectoris: Secondary | ICD-10-CM | POA: Diagnosis present

## 2024-03-07 DIAGNOSIS — J449 Chronic obstructive pulmonary disease, unspecified: Secondary | ICD-10-CM | POA: Diagnosis present

## 2024-03-07 DIAGNOSIS — F1410 Cocaine abuse, uncomplicated: Secondary | ICD-10-CM | POA: Diagnosis present

## 2024-03-07 DIAGNOSIS — I5021 Acute systolic (congestive) heart failure: Secondary | ICD-10-CM | POA: Diagnosis present

## 2024-03-07 DIAGNOSIS — I70202 Unspecified atherosclerosis of native arteries of extremities, left leg: Secondary | ICD-10-CM | POA: Diagnosis present

## 2024-03-07 DIAGNOSIS — E1165 Type 2 diabetes mellitus with hyperglycemia: Secondary | ICD-10-CM | POA: Diagnosis present

## 2024-03-07 DIAGNOSIS — I200 Unstable angina: Secondary | ICD-10-CM | POA: Diagnosis not present

## 2024-03-07 DIAGNOSIS — Z6841 Body Mass Index (BMI) 40.0 and over, adult: Secondary | ICD-10-CM | POA: Diagnosis not present

## 2024-03-07 DIAGNOSIS — I428 Other cardiomyopathies: Secondary | ICD-10-CM | POA: Diagnosis present

## 2024-03-07 DIAGNOSIS — Z888 Allergy status to other drugs, medicaments and biological substances status: Secondary | ICD-10-CM | POA: Diagnosis not present

## 2024-03-07 LAB — GLUCOSE, CAPILLARY
Glucose-Capillary: 265 mg/dL — ABNORMAL HIGH (ref 70–99)
Glucose-Capillary: 286 mg/dL — ABNORMAL HIGH (ref 70–99)
Glucose-Capillary: 238 mg/dL — ABNORMAL HIGH (ref 70–99)
Glucose-Capillary: 226 mg/dL — ABNORMAL HIGH (ref 70–99)
Glucose-Capillary: 247 mg/dL — ABNORMAL HIGH (ref 70–99)
Glucose-Capillary: 228 mg/dL — ABNORMAL HIGH (ref 70–99)

## 2024-03-07 LAB — BASIC METABOLIC PANEL WITH GFR
Anion gap: 8 (ref 5–15)
BUN: 16 mg/dL (ref 8–23)
CO2: 25 mmol/L (ref 22–32)
Calcium: 8.9 mg/dL (ref 8.9–10.3)
Chloride: 101 mmol/L (ref 98–111)
Creatinine, Ser: 1.21 mg/dL (ref 0.61–1.24)
GFR, Estimated: >60 mL/min (ref >60)
Glucose, Bld: 251 mg/dL — ABNORMAL HIGH (ref 70–99)
Potassium: 3.9 mmol/L (ref 3.5–5.1)
Sodium: 134 mmol/L — ABNORMAL LOW (ref 135–145)

## 2024-03-07 LAB — HEPARIN LEVEL (UNFRACTIONATED)
Heparin Unfractionated: 0.43 [IU]/mL (ref 0.30–0.70)
Heparin Unfractionated: 0.59 [IU]/mL (ref 0.30–0.70)

## 2024-03-07 LAB — HEMOGLOBIN A1C
Hgb A1c MFr Bld: 12.4 % — ABNORMAL HIGH (ref 4.8–5.6)
Mean Plasma Glucose: 309 mg/dL

## 2024-03-07 LAB — CBC
HCT: 37.4 % — ABNORMAL LOW (ref 39.0–52.0)
Hemoglobin: 12.8 g/dL — ABNORMAL LOW (ref 13.0–17.0)
MCH: 28.6 pg (ref 26.0–34.0)
MCHC: 34.2 g/dL (ref 30.0–36.0)
MCV: 83.7 fL (ref 80.0–100.0)
Platelets: 226 K/uL (ref 150–400)
RBC: 4.47 MIL/uL (ref 4.22–5.81)
RDW: 13.6 % (ref 11.5–15.5)
WBC: 9.5 K/uL (ref 4.0–10.5)
nRBC: 0 % (ref 0.0–0.2)

## 2024-03-07 MED ORDER — SPIRONOLACTONE 12.5 MG HALF TABLET
12.5000 mg | ORAL_TABLET | Freq: Every day | ORAL | Status: DC
  Administered 2024-03-07: 12.5 mg via ORAL
  Filled 2024-03-07 (×2): qty 1

## 2024-03-07 NOTE — Progress Notes (Signed)
 [facility] CLINIC CARDIOLOGY PROGRESS NOTE   Patient ID: Greg Hernandez MRN: [id number] DOB/AGE: 01-22-1961 63 y.o.  Admit date: 03/05/2024 Referring Physician Dr. Delayne Solian Primary Physician Perri, Mychal Burnard, PA-C  Primary Cardiologist Dr. Margery Ruth [facility]) Reason for Consultation chest pain  HPI: Greg Hernandez is a 63 y.o. male with a past medical history of hypertension, hyperlipidemia, peripheral artery disease, type 2 diabetes, COPD, tobacco use, cocaine use who presented to the ED on 03/05/2024 for elevated blood glucose, chest pain, shortness of breath.  Cardiology was consulted for further evaluation.  Interval History:  - Patient seen and examined this morning, resting comfortably in hospital bed. - States that he is feeling better overall today.  Denies any recurrence of chest pain or shortness of breath. - BP and heart rate remain well-controlled.  No events on telemetry noted.  Review of systems complete and found to be negative unless listed above   Vitals:   03/06/24 1948 03/06/24 2341 03/07/24 0619 03/07/24 0739  BP:  115/62 (!) 109/49 125/63  Pulse: 93 95 87 89  Resp:  18 20   Temp:  98.3 F (36.8 C) 97.9 F (36.6 C) 99.1 F (37.3 C)  TempSrc:    Oral  SpO2: 98% 98% 98% 98%  Weight:      Height:         Intake/Output Summary (Last 24 hours) at 03/07/2024 1013 Last data filed at 03/07/2024 0334 Gross per 24 hour  Intake 427.88 ml  Output --  Net 427.88 ml     PHYSICAL EXAM General: Well-appearing male, well nourished, in no acute distress. HEENT: Normocephalic and atraumatic. Neck: No JVD.  Lungs: Normal respiratory effort on room air. Clear bilaterally to auscultation. No wheezes, crackles, rhonchi.  Heart: HRRR. Normal S1 and S2 without gallops or murmurs. Radial & DP pulses 2+ bilaterally. Abdomen: Non-distended appearing.  Msk: Normal strength and tone for age. Extremities: No clubbing, cyanosis or edema.   Neuro: Alert and oriented X  3. Psych: Mood appropriate, affect congruent.    LABS: Basic Metabolic Panel: Recent Labs    03/06/24 1050 03/07/24 0312  NA 136 134*  K 3.9 3.9  CL 103 101  CO2 25 25  GLUCOSE 184* 251*  BUN 19 16  CREATININE 1.23 1.21  CALCIUM  9.1 8.9   Liver Function Tests: No results for input(s): AST, ALT, ALKPHOS, BILITOT, PROT, ALBUMIN in the last 72 hours. No results for input(s): LIPASE, AMYLASE in the last 72 hours. CBC: Recent Labs    03/06/24 1050 03/07/24 0312  WBC 7.1 9.5  HGB 13.1 12.8*  HCT 40.1 37.4*  MCV 85.0 83.7  PLT 233 226   Cardiac Enzymes: Recent Labs    03/05/24 2345 03/06/24 0248  TROPONINIHS 317* 392*   BNP: Recent Labs    03/05/24 2345  BNP 9.6   D-Dimer: No results for input(s): DDIMER in the last 72 hours. Hemoglobin A1C: Recent Labs    03/06/24 0248  HGBA1C 12.4*   Fasting Lipid Panel: No results for input(s): CHOL, HDL, LDLCALC, TRIG, CHOLHDL, LDLDIRECT in the last 72 hours. Thyroid Function Tests: No results for input(s): TSH, T4TOTAL, T3FREE, THYROIDAB in the last 72 hours.  Invalid input(s): FREET3 Anemia Panel: No results for input(s): VITAMINB12, FOLATE, FERRITIN, TIBC, IRON, RETICCTPCT in the last 72 hours.  ECHOCARDIOGRAM COMPLETE Result Date: 03/06/2024    ECHOCARDIOGRAM REPORT   Patient Name:   Greg Hernandez Date of Exam: 03/06/2024 Medical Rec #:  [id number]  Height:       69.0 in Accession #:    [id number]       Weight:       306.0 lb Date of Birth:  May 20, 1961         BSA:          2.475 m Patient Age:    63 years         BP:           129/72 mmHg Patient Gender: M                HR:           86 bpm. Exam Location:  [facility] Procedure: 2D Echo, Cardiac Doppler, Color Doppler and Intracardiac            Opacification Agent (Both Spectral and Color Flow Doppler were            utilized during procedure). Indications:     NSTEMI I21.4  History:         Patient has no prior  history of Echocardiogram examinations.                  Risk Factors:Hypertension.  Sonographer:     Bari Roar Referring Phys:  [id number] DELAYNE LULLA SOLIAN Diagnosing Phys: Cara JONETTA Lovelace MD IMPRESSIONS  1. Inferior/posterior hypo.  2. Left ventricular ejection fraction, by estimation, is 35 to 40%. The left ventricle has moderately decreased function. The left ventricle demonstrates regional wall motion abnormalities (see scoring diagram/findings for description). The left ventricular internal cavity size was mildly to moderately dilated. There is mild concentric left ventricular hypertrophy. Left ventricular diastolic parameters are consistent with Grade I diastolic dysfunction (impaired relaxation).  3. Right ventricular systolic function is normal. The right ventricular size is normal.  4. The mitral valve is normal in structure. No evidence of mitral valve regurgitation.  5. The aortic valve is normal in structure. Aortic valve regurgitation is not visualized. FINDINGS  Left Ventricle: Left ventricular ejection fraction, by estimation, is 35 to 40%. The left ventricle has moderately decreased function. The left ventricle demonstrates regional wall motion abnormalities. Definity  contrast agent was given IV to delineate the left ventricular endocardial borders. Strain was performed and the global longitudinal strain is indeterminate. Global longitudinal strain performed but not reported based on interpreter judgement due to suboptimal tracking. The left ventricular internal cavity size was mildly to moderately dilated. There is mild concentric left ventricular hypertrophy. Left ventricular diastolic parameters are consistent with Grade I diastolic dysfunction (impaired relaxation). Right Ventricle: The right ventricular size is normal. No increase in right ventricular wall thickness. Right ventricular systolic function is normal. Left Atrium: Left atrial size was normal in size. Right Atrium: Right atrial size  was normal in size. Pericardium: There is no evidence of pericardial effusion. Mitral Valve: The mitral valve is normal in structure. No evidence of mitral valve regurgitation. MV peak gradient, 3.2 mmHg. The mean mitral valve gradient is 2.0 mmHg. Tricuspid Valve: The tricuspid valve is normal in structure. Tricuspid valve regurgitation is not demonstrated. Aortic Valve: The aortic valve is normal in structure. Aortic valve regurgitation is not visualized. Aortic valve mean gradient measures 4.0 mmHg. Aortic valve peak gradient measures 9.1 mmHg. Aortic valve area, by VTI measures 2.28 cm. Pulmonic Valve: The pulmonic valve was normal in structure. Pulmonic valve regurgitation is not visualized. Aorta: The ascending aorta was not well visualized. IAS/Shunts: No atrial level shunt detected by color flow Doppler. Additional Comments:  Inferior/posterior hypo. 3D was performed not requiring image post processing on an independent workstation and was indeterminate.  LEFT VENTRICLE PLAX 2D LVIDd:         5.50 cm      Diastology LVIDs:         4.70 cm      LV e' medial:    7.51 cm/s LV PW:         1.20 cm      LV E/e' medial:  14.1 LV IVS:        1.30 cm      LV e' lateral:   6.31 cm/s LVOT diam:     2.00 cm      LV E/e' lateral: 16.8 LV SV:         60 LV SV Index:   24 LVOT Area:     3.14 cm  LV Volumes (MOD) LV vol d, MOD A2C: 149.0 ml LV vol d, MOD A4C: 141.0 ml LV vol s, MOD A2C: 97.1 ml LV vol s, MOD A4C: 90.1 ml LV SV MOD A2C:     51.9 ml LV SV MOD A4C:     141.0 ml LV SV MOD BP:      55.0 ml RIGHT VENTRICLE RV Basal diam:  3.00 cm RV Mid diam:    2.80 cm RV S prime:     13.70 cm/s TAPSE (M-mode): 2.1 cm LEFT ATRIUM             Index        RIGHT ATRIUM           Index LA diam:        3.90 cm 1.58 cm/m   RA Area:     14.30 cm LA Vol (A2C):   46.3 ml 18.70 ml/m  RA Volume:   34.00 ml  13.74 ml/m LA Vol (A4C):   47.1 ml 19.03 ml/m LA Biplane Vol: 47.6 ml 19.23 ml/m  AORTIC VALVE                    PULMONIC  VALVE AV Area (Vmax):    2.01 cm     PV Vmax:        1.09 m/s AV Area (Vmean):   1.90 cm     PV Peak grad:   4.8 mmHg AV Area (VTI):     2.28 cm     RVOT Peak grad: 2 mmHg AV Vmax:           151.00 cm/s AV Vmean:          93.800 cm/s AV VTI:            0.262 m AV Peak Grad:      9.1 mmHg AV Mean Grad:      4.0 mmHg LVOT Vmax:         96.60 cm/s LVOT Vmean:        56.700 cm/s LVOT VTI:          0.190 m LVOT/AV VTI ratio: 0.73  AORTA Ao Root diam: 2.60 cm Ao Asc diam:  2.80 cm MITRAL VALVE                TRICUSPID VALVE MV Area (PHT): 3.56 cm     TR Peak grad:   19.5 mmHg MV Area VTI:   2.62 cm     TR Vmax:        221.00 cm/s MV Peak grad:  3.2 mmHg MV Mean grad:  2.0 mmHg     SHUNTS MV Vmax:       0.90 m/s     Systemic VTI:  0.19 m MV Vmean:      62.7 cm/s    Systemic Diam: 2.00 cm MV Decel Time: 213 msec MV E velocity: 106.00 cm/s MV A velocity: 114.00 cm/s MV E/A ratio:  0.93 MV A Prime:    12.6 cm/s Cara JONETTA Lovelace MD Electronically signed by Cara JONETTA Lovelace MD Signature Date/Time: 03/06/2024/1:10:21 PM    Final    DG Chest Portable 1 View Result Date: 03/06/2024 CLINICAL DATA:  Chest pain leg edema EXAM: PORTABLE CHEST 1 VIEW COMPARISON:  07/24/2011 FINDINGS: Mild cardiomegaly. No acute airspace disease, pleural effusion or pneumothorax IMPRESSION: No active disease. Mild cardiomegaly. Electronically Signed   By: Luke Bun M.D.   On: 03/06/2024 01:08     ECHO as above  TELEMETRY reviewed by me 03/07/24: Sinus rhythm rate 90s  EKG reviewed by me 03/07/24: Sinus rhythm rate 97 bpm, prolonged QT.  DATA reviewed by me 03/07/24: last 24h vitals tele labs imaging I/O, hospitalist progress note  Principal Problem:   Unstable angina (HCC) Active Problems:   Uncontrolled type 2 diabetes mellitus with hyperglycemia, without long-term current use of insulin  (HCC)   OSA (obstructive sleep apnea)   Chronic obstructive pulmonary disease (COPD) (HCC)   Obesity, Class III, BMI 40-49.9 (morbid  obesity)   Intermittent claudication / Left SFA stenosis  (HCC)   HLD (hyperlipidemia)   Substance abuse (HCC)   Tobacco use disorder    ASSESSMENT AND PLAN: Greg Hernandez is a 63 y.o. male with a past medical history of hypertension, hyperlipidemia, peripheral artery disease, type 2 diabetes, COPD, tobacco use, cocaine use who presented to the ED on 03/05/2024 for elevated blood glucose, chest pain, shortness of breath.  Cardiology was consulted for further evaluation.  # Angina # Elevated troponin # Cardiomyopathy # Uncontrolled diabetes # Cocaine use Patient presented with complaints of significantly elevated blood glucose, chest pain, shortness of breath.  Blood glucose in the ED greater than 600.  Troponins checked and trended 317 > 392.  UDS was positive for cocaine.  EKG without acute ischemic changes.  Echo this admission revealed EF 35-40%, inferior/posterior hypokinesis, mild to moderate LV dilation, mild concentric LVH, grade 1 diastolic dysfunction, normal RV size and function - Continue IV heparin  for 48 to 72 hours.   - Continue aspirin  81 mg daily, Crestor  40 mg daily. - Start spironolactone  12.5 mg daily.  Continue losartan  100 mg daily, metoprolol  succinate 25 mg daily.  Likely plan to add SGLT2 inhibitor tomorrow pending cost analysis. - Will plan for further evaluation with nuclear stress test versus CTA coronary versus cardiac catheterization tomorrow.  Will make patient n.p.o. at midnight.  This patient's case was discussed and created with Dr. Lovelace and he is in agreement.  Signed:  Danita Bloch, PA-C  03/07/2024, 10:13 AM [facility] Cardiology

## 2024-03-07 NOTE — Plan of Care (Signed)

## 2024-03-07 NOTE — Hospital Course (Signed)
 63yo with h/o HTN, PAD, HLD, COPD, DM, tobacco use, cocaine use, and morbid obesity who presented on 8/16 with CP/SOB. Troponin elevated. C/W NSTEMI, started on Heparin . Echo with acute systolic dysfunction, EF 35-40% with WMA and grade 1 DD, consistent with MI. Cardiology consulting, heart cath scheduled 8/20.

## 2024-03-07 NOTE — Progress Notes (Signed)
 PHARMACY - ANTICOAGULATION CONSULT NOTE  Pharmacy Consult for Heparin   Indication: chest pain/ACS  Allergies  Allergen Reactions   Metformin Diarrhea    Patient Measurements: Height: 5' 9 (175.3 cm) Weight: (!) 138.8 kg (306 lb) IBW/kg (Calculated) : 70.7 HEPARIN  DW (KG): 103.5 Heparin  DW = 103.5 kg   Vital Signs: Temp: 98.3 F (36.8 C) (08/16 2341) Temp Source: Oral (08/16 1600) BP: 115/62 (08/16 2341) Pulse Rate: 95 (08/16 2341)  Labs: Recent Labs    03/05/24 2345 03/06/24 0248 03/06/24 0401 03/06/24 1050 03/06/24 1717 03/07/24 0312  HGB 13.3  --   --  13.1  --  12.8*  HCT 39.3  --   --  40.1  --  37.4*  PLT 232  --   --  233  --  226  APTT  --   --  27  --   --   --   HEPARINUNFRC  --   --   --  0.18* <0.10* 0.43  CREATININE 1.56*  --   --  1.23  --  1.21  TROPONINIHS 317* 392*  --   --   --   --     Estimated Creatinine Clearance: 87.7 mL/min (by C-G formula based on SCr of 1.21 mg/dL).   Medical History: Past Medical History:  Diagnosis Date   Hypertension     Medications:   Assessment: Pharmacy consulted to dose heparin  in this 63 year old male admitted with ACS/NSTEMI.  No prior anticoag noted.  CrCl = 68 ml/min   8/16 1050 HL 0.18,  SUBtherapeutic 8/16 1717 HL <0.10, SUBtherapeutic 8/17 0312 HL 0.43,  Therapeutic X 1  Goal of Therapy:  Heparin  level 0.3-0.7 units/ml Monitor platelets by anticoagulation protocol: Yes   Plan:  8/17:  HL @ 0312 = 0.43, therapeutic X 1 - will continue pt on current rate and recheck HL in 6 hrs Continue to monitor H&H and platelets  Thank you for involving pharmacy in this patient's care.   Torin Modica D Clinical Pharmacist 03/07/2024 3:56 AM

## 2024-03-07 NOTE — Progress Notes (Signed)
 PHARMACY - ANTICOAGULATION CONSULT NOTE  Pharmacy Consult for Heparin   Indication: chest pain/ACS  Allergies  Allergen Reactions   Metformin Diarrhea    Patient Measurements: Height: 5' 9 (175.3 cm) Weight: (!) 138.8 kg (306 lb) IBW/kg (Calculated) : 70.7 HEPARIN  DW (KG): 103.5 Heparin  DW = 103.5 kg   Vital Signs: Temp: 99.1 F (37.3 C) (08/17 0739) Temp Source: Oral (08/17 0739) BP: 125/63 (08/17 0739) Pulse Rate: 89 (08/17 0739)  Labs: Recent Labs    03/05/24 2345 03/05/24 2345 03/06/24 0248 03/06/24 0401 03/06/24 1050 03/06/24 1717 03/07/24 0312 03/07/24 0907  HGB 13.3  --   --   --  13.1  --  12.8*  --   HCT 39.3  --   --   --  40.1  --  37.4*  --   PLT 232  --   --   --  233  --  226  --   APTT  --   --   --  27  --   --   --   --   HEPARINUNFRC  --    < >  --   --  0.18* <0.10* 0.43 0.59  CREATININE 1.56*  --   --   --  1.23  --  1.21  --   TROPONINIHS 317*  --  392*  --   --   --   --   --    < > = values in this interval not displayed.    Estimated Creatinine Clearance: 87.7 mL/min (by C-G formula based on SCr of 1.21 mg/dL).   Medical History: Past Medical History:  Diagnosis Date   Hypertension     Medications:   Assessment: Pharmacy consulted to dose heparin  in this 63 year old male admitted with ACS/NSTEMI.  No prior anticoag noted.  CrCl = 68 ml/min   8/16 1050 HL 0.18,  SUBtherapeutic 8/16 1717 HL <0.10, SUBtherapeutic 8/17 0312 HL 0.43,  Therapeutic X 1 8/17 0907 HL 0.59, therapeutic x 2  Goal of Therapy:  Heparin  level 0.3-0.7 units/ml Monitor platelets by anticoagulation protocol: Yes   Plan:  8/17 0907 HL 0.59, therapeutic x 2 - will continue pt on current rate of 2000 units/hr and recheck HL with am labs Continue to monitor H&H and platelets  Thank you for involving pharmacy in this patient's care.   Donaldo Teegarden A Clinical Pharmacist 03/07/2024 10:17 AM

## 2024-03-07 NOTE — Progress Notes (Signed)
 Progress Note   Patient: Greg Hernandez FMW:[id number] DOB: [birth year] DOA: 03/05/2024     0 DOS: the patient was seen and examined on 03/07/2024   Brief hospital course: 63yo with h/o HTN, PAD, HLD, COPD, DM, tobacco use, cocaine use, and morbid obesity who presented on 8/16 with CP/SOB. Troponin elevated. C/W NSTEMI, started on Heparin . Echo with acute systolic dysfunction, EF 35-40% with WMA and grade 1 DD, consistent with MI. Cardiology consulting, cath Monday.   Assessment and Plan:  NSTEMI Patient presented with CP and DOE Heparin  infusion Aspirin , continue home rosuvastatin  and losartan  Started on Toprol  XL Nitroglycerin  sublingual as needed chest pain with morphine  for breakthrough Echocardiogram done and c/w NSTEMI Cardiology consulted Planning for cath on Monday   Uncontrolled type 2 diabetes mellitus with hyperglycemia, without long-term current use of insulin  A1c 12.4, very poor control Received IV insulin  and fluids in the ED Started on basal insulin , likely to need as an outpatient Sliding scale coverage   Intermittent claudication / Left SFA stenosis / HLD Has seen vascular in the past March 2025 L SFA stenosis of 50-75%, R ABI 0.91 with TP 58 and L ABI 0.8 with TP 52  Continue aspirin  and rosuvastatin     Chronic obstructive pulmonary disease (COPD) with ongoing tobacco use Cessation encouraged Evaluated by pulmonology in July 2025 Continue Advair (Breo per formulary), SIngulair  Albuterol  nebs as needed   HTN Continue losartan  Started on Toprol  XL, spironolactone  Nicotine  patch provided   Obstructive sleep apnea Continue CPAP   Obesity, Class III, BMI 40-49.9 (morbid obesity) Body mass index is 45.19 kg/m.SABRA  Weight loss should be encouraged on an ongoing basis Hold Ozempic for now Outpatient PCP/bariatric medicine f/u encouraged Significantly low or high BMI is associated with higher medical risk including morbidity and mortality    Substance  abuse  UDS + cocaine Extensive counseling on need for complete abstinence provided at the time of admission Patient seems very motivated to quit         Consultants: Cardiology   Procedures: Echocardiogram 8/16   Antibiotics: None        Subjective: Feeling better today.  Understands importance of stopping cocaine, improving his lifestyle.   Objective: Vitals:   03/07/24 1154 03/07/24 1505  BP: 127/76 (!) 138/90  Pulse: 87 95  Resp:  20  Temp: 97.7 F (36.5 C) 98.5 F (36.9 C)  SpO2: 97% 99%    Intake/Output Summary (Last 24 hours) at 03/07/2024 1623 Last data filed at 03/07/2024 1300 Gross per 24 hour  Intake 1147.88 ml  Output --  Net 1147.88 ml   Filed Weights   03/05/24 2333 03/05/24 2343  Weight: (!) 138.8 kg (!) 138.8 kg    Exam:  General:  Appears calm and comfortable and is in NAD Eyes:  normal lids, iris ENT:  grossly normal hearing, lips & tongue, mmm Cardiovascular:  RRR, no m/r/g. No LE edema.  Respiratory:   CTA bilaterally with no wheezes/rales/rhonchi.  Normal respiratory effort. Abdomen:  soft, NT, ND Skin:  no rash or induration seen on limited exam Musculoskeletal:  grossly normal tone BUE/BLE, good ROM, no bony abnormality Psychiatric:  grossly normal mood and affect, speech fluent and appropriate, AOx3 Neurologic:  CN 2-12 grossly intact, moves all extremities in coordinated fashion  Data Reviewed: I have reviewed the patient's lab results since admission.  Pertinent labs for today include:   Glucose 251 Stable CBC     Family Communication: None present  Disposition: Status is: inpatient  Admit - It is my clinical opinion that admission to INPATIENT is reasonable and necessary because of the expectation that this patient will require hospital care that crosses at least 2 midnights to treat this condition based on the medical complexity of the problems presented.  Given the aforementioned information, the predictability of an  adverse outcome is felt to be significant.      Time spent: 50 minutes  Unresulted Labs (From admission, onward)     Start     Ordered   03/08/24 0500  Basic metabolic panel with GFR  Tomorrow morning,   R        03/07/24 0744   03/08/24 0500  CBC  Tomorrow morning,   R        03/07/24 0744   03/08/24 0500  Heparin  level (unfractionated)  Tomorrow morning,   R        03/07/24 1019   03/06/24 0500  Lipoprotein A (LPA)  Once,   R        03/06/24 0500             Author: Delon Herald, MD 03/07/2024 4:23 PM  For on call review [email or web address].

## 2024-03-08 ENCOUNTER — Inpatient Hospital Stay

## 2024-03-08 ENCOUNTER — Telehealth (HOSPITAL_COMMUNITY): Payer: Self-pay | Admitting: Pharmacy Technician

## 2024-03-08 ENCOUNTER — Other Ambulatory Visit (HOSPITAL_COMMUNITY): Payer: Self-pay

## 2024-03-08 ENCOUNTER — Other Ambulatory Visit

## 2024-03-08 DIAGNOSIS — I200 Unstable angina: Secondary | ICD-10-CM | POA: Diagnosis not present

## 2024-03-08 LAB — GLUCOSE, CAPILLARY
Glucose-Capillary: 226 mg/dL — ABNORMAL HIGH (ref 70–99)
Glucose-Capillary: 247 mg/dL — ABNORMAL HIGH (ref 70–99)
Glucose-Capillary: 165 mg/dL — ABNORMAL HIGH (ref 70–99)
Glucose-Capillary: 203 mg/dL — ABNORMAL HIGH (ref 70–99)
Glucose-Capillary: 189 mg/dL — ABNORMAL HIGH (ref 70–99)

## 2024-03-08 LAB — BASIC METABOLIC PANEL WITH GFR
Anion gap: 8 (ref 5–15)
BUN: 14 mg/dL (ref 8–23)
CO2: 25 mmol/L (ref 22–32)
Calcium: 9 mg/dL (ref 8.9–10.3)
Chloride: 102 mmol/L (ref 98–111)
Creatinine, Ser: 1.13 mg/dL (ref 0.61–1.24)
GFR, Estimated: >60 mL/min (ref >60)
Glucose, Bld: 223 mg/dL — ABNORMAL HIGH (ref 70–99)
Potassium: 4.1 mmol/L (ref 3.5–5.1)
Sodium: 135 mmol/L (ref 135–145)

## 2024-03-08 LAB — HEPARIN LEVEL (UNFRACTIONATED): Heparin Unfractionated: 0.55 [IU]/mL (ref 0.30–0.70)

## 2024-03-08 LAB — CBC
HCT: 39.5 % (ref 39.0–52.0)
Hemoglobin: 13.5 g/dL (ref 13.0–17.0)
MCH: 28.7 pg (ref 26.0–34.0)
MCHC: 34.2 g/dL (ref 30.0–36.0)
MCV: 84 fL (ref 80.0–100.0)
Platelets: 247 K/uL (ref 150–400)
RBC: 4.7 MIL/uL (ref 4.22–5.81)
RDW: 13.8 % (ref 11.5–15.5)
WBC: 7.9 K/uL (ref 4.0–10.5)
nRBC: 0 % (ref 0.0–0.2)

## 2024-03-08 LAB — LIPOPROTEIN A (LPA): Lipoprotein (a): 122.2 nmol/L — ABNORMAL HIGH (ref <75.0)

## 2024-03-08 MED ORDER — DAPAGLIFLOZIN PROPANEDIOL 10 MG PO TABS
10.0000 mg | ORAL_TABLET | Freq: Every day | ORAL | Status: DC
  Administered 2024-03-08 – 2024-03-09 (×2): 10 mg via ORAL
  Filled 2024-03-08 (×3): qty 1

## 2024-03-08 MED ORDER — LIVING WELL WITH DIABETES BOOK
Freq: Once | Status: AC
  Filled 2024-03-08: qty 1

## 2024-03-08 MED ORDER — TECHNETIUM TC 99M TETROFOSMIN IV KIT
32.3900 | PACK | Freq: Once | INTRAVENOUS | Status: AC | PRN
  Administered 2024-03-08: 32.39 via INTRAVENOUS

## 2024-03-08 MED ORDER — SPIRONOLACTONE 25 MG PO TABS
25.0000 mg | ORAL_TABLET | Freq: Every day | ORAL | Status: DC
  Administered 2024-03-08 – 2024-03-09 (×2): 25 mg via ORAL
  Filled 2024-03-08 (×2): qty 1

## 2024-03-08 MED ORDER — INSULIN STARTER KIT- PEN NEEDLES (ENGLISH)
1.0000 | Freq: Once | Status: AC
  Administered 2024-03-08: 1
  Filled 2024-03-08 (×2): qty 1

## 2024-03-08 MED ORDER — REGADENOSON 0.4 MG/5ML IV SOLN
0.4000 mg | Freq: Once | INTRAVENOUS | Status: AC
  Administered 2024-03-08: 0.4 mg via INTRAVENOUS

## 2024-03-08 MED ORDER — ACETAMINOPHEN 325 MG PO TABS: 650.0000 mg | ORAL_TABLET | ORAL | Status: DC | PRN

## 2024-03-08 NOTE — Telephone Encounter (Signed)
 Pharmacy Patient Advocate Encounter  Received notification from [employer] Medicaid that Prior Authorization for Farxiga  10MG  tablets has been APPROVED from 03/08/2024 to 03/08/2025. Ran test claim, Copay is $4.00. This test claim was processed through [facility]- copay amounts may vary at other pharmacies due to pharmacy/plan contracts, or as the patient moves through the different stages of their insurance plan.   PA #/Case ID/Reference #: [id number]

## 2024-03-08 NOTE — Plan of Care (Signed)

## 2024-03-08 NOTE — Telephone Encounter (Signed)
 Pharmacy Patient Advocate Encounter  Received notification from [facility] Medicaid that Prior Authorization for FreeStyle Libre 3 Plus Sensor  has been DENIED.  Full denial letter will be uploaded to the media tab. See denial reason below. Requires documentation confirming insulin -dependent diabetes diagnosis and ability to use prescribed therapeutic CGM system.   PA #/Case ID/Reference #: [id number]

## 2024-03-08 NOTE — Progress Notes (Signed)
 Progress Note   Patient: Greg Hernandez FMW:[id number] DOB: [birth year] DOA: 03/05/2024     1 DOS: the patient was seen and examined on 03/08/2024   Brief hospital course: 63yo with h/o HTN, PAD, HLD, COPD, DM, tobacco use, cocaine use, and morbid obesity who presented on 8/16 with CP/SOB. Troponin elevated. C/W NSTEMI, started on Heparin . Echo with acute systolic dysfunction, EF 35-40% with WMA and grade 1 DD, consistent with MI. Cardiology consulting, cath Monday.  Assessment and Plan:  NSTEMI Patient presented with CP and DOE Heparin  infusion x 48-72 hours Aspirin , continue home rosuvastatin  and losartan  Started on Toprol  XL Nitroglycerin  sublingual as needed chest pain with morphine  for breakthrough Echocardiogram done and c/w NSTEMI Cardiology consulted Planning for 2-day stress test Given WMA on echo, he appears to be at high risk for needing cath Cardiology has added spironolactone  and Farxiga    Uncontrolled type 2 diabetes mellitus with hyperglycemia, without long-term current use of insulin  A1c 12.4, very poor control Received IV insulin  and fluids in the ED Started on basal insulin , likely to need as an outpatient Sliding scale coverage  HLD Lipid panel pending Started on atorvastatin   Intermittent claudication / Left SFA stenosis / HLD Has seen vascular in the past March 2025 L SFA stenosis of 50-75%, R ABI 0.91 with TP 58 and L ABI 0.8 with TP 52  Continue aspirin  and rosuvastatin     Chronic obstructive pulmonary disease (COPD) with ongoing tobacco use Cessation encouraged Evaluated by pulmonology in July 2025 Continue Advair (Breo per formulary), SIngulair  Albuterol  nebs as needed   HTN Continue losartan  Started on Toprol  XL, spironolactone  Nicotine  patch provided   Obstructive sleep apnea Continue CPAP   Obesity, Class III, BMI 40-49.9 (morbid obesity) Body mass index is 45.19 kg/m.SABRA  Weight loss should be encouraged on an ongoing basis Hold  Ozempic for now Outpatient PCP/bariatric medicine f/u encouraged Significantly low or high BMI is associated with higher medical risk including morbidity and mortality    Substance abuse  UDS + cocaine Extensive counseling on need for complete abstinence provided at the time of admission Patient seems very motivated to quit         Consultants: Cardiology   Procedures: Echocardiogram 8/16   Antibiotics: None      30 Day Unplanned Readmission Risk Score    Flowsheet Row ED to Hosp-Admission (Current) from 03/05/2024 in [facility] PCU  30 Day Unplanned Readmission Risk Score (%) 13.65 Filed at 03/08/2024 1200    This score is the patient's risk of an unplanned readmission within 30 days of being discharged (0 -100%). The score is based on dignosis, age, lab data, medications, orders, and past utilization.   Low:  0-14.9   Medium: 15-21.9   High: 22-29.9   Extreme: 30 and above           Subjective: Feeling fine, understands plan per cardiology for 2 day stress test.   Objective: Vitals:   03/08/24 1336 03/08/24 1530  BP: 113/72 103/61  Pulse: 87 84  Resp: 19 16  Temp: 97.6 F (36.4 C) 97.6 F (36.4 C)  SpO2: 99% 96%    Intake/Output Summary (Last 24 hours) at 03/08/2024 1538 Last data filed at 03/08/2024 0900 Gross per 24 hour  Intake 902.26 ml  Output --  Net 902.26 ml   Filed Weights   03/05/24 2333 03/05/24 2343  Weight: (!) 138.8 kg (!) 138.8 kg    Exam:  General:  Appears calm and comfortable  and is in NAD Eyes:  normal lids, iris ENT:  grossly normal hearing, lips & tongue, mmm Cardiovascular:  RRR, no m/r/g. No LE edema.  Respiratory:   CTA bilaterally with no wheezes/rales/rhonchi.  Normal respiratory effort. Abdomen:  soft, NT, ND Skin:  no rash or induration seen on limited exam Musculoskeletal:  grossly normal tone BUE/BLE, good ROM, no bony abnormality Psychiatric:  grossly normal mood and affect, speech fluent and  appropriate, AOx3 Neurologic:  CN 2-12 grossly intact, moves all extremities in coordinated fashion  Data Reviewed: I have reviewed the patient's lab results since admission.  Pertinent labs for today include:   Glucose 223 Normal CBC     Family Communication: Wife was present; she would appreciate being called tomorrow with stress test results since she will be at work  Disposition: Status is: Inpatient Remains inpatient appropriate because: ongoing management     Time spent: 50 minutes  Unresulted Labs (From admission, onward)     Start     Ordered   03/09/24 0500  Heparin  level (unfractionated)  Tomorrow morning,   R        03/08/24 0519   03/09/24 0500  CBC  Tomorrow morning,   R       Question:  Specimen collection method  Answer:  Lab=Lab collect   03/08/24 0955   03/09/24 0500  Lipid panel  Tomorrow morning,   R       Question:  Specimen collection method  Answer:  Lab=Lab collect   03/08/24 9044             Author: Delon Herald, MD 03/08/2024 3:38 PM  For on call review [email or web address].

## 2024-03-08 NOTE — Telephone Encounter (Signed)
 Pharmacy Patient Advocate Encounter   Received notification from Inpatient Request that prior authorization for Farxiga  10MG  tablets  is required/requested.   Insurance verification completed.   The patient is insured through [facility] [place] .   Per test claim: PA required; PA submitted to above mentioned insurance via Latent Key/confirmation #/EOC [employer] Status is pending

## 2024-03-08 NOTE — Plan of Care (Signed)

## 2024-03-08 NOTE — Progress Notes (Addendum)
 [facility] CLINIC CARDIOLOGY PROGRESS NOTE   Patient ID: Greg Hernandez MRN: [id number] DOB/AGE: Jan 22, 1961 63 y.o.  Admit date: 03/05/2024 Referring Physician Dr. Delayne Solian Primary Physician Perri, Mychal Burnard, PA-C  Primary Cardiologist Dr. Margery Ruth [facility]) Reason for Consultation chest pain  HPI: Greg Hernandez is a 63 y.o. male with a past medical history of hypertension, hyperlipidemia, peripheral artery disease, type 2 diabetes, COPD, tobacco use, cocaine use who presented to the ED on 03/05/2024 for elevated blood glucose, chest pain, shortness of breath.  Cardiology was consulted for further evaluation.  Interval History:  - Patient seen and examined this morning, resting comfortably in hospital bed. - States that he is feeling good overall today.  Denies any recurrence of chest pain or shortness of breath. - BP and heart rate remain well-controlled.  No events on telemetry noted. - Plan for 2 day cardiac stress test. Patient has remained NPO.  Review of systems complete and found to be negative unless listed above   Vitals:   03/07/24 2007 03/08/24 0012 03/08/24 0452 03/08/24 0802  BP: 114/63 131/74 (!) 127/93 126/64  Pulse: 89 82 82 90  Resp: 19 18 20 18   Temp: 97.9 F (36.6 C) 98.3 F (36.8 C) 98.4 F (36.9 C) 97.7 F (36.5 C)  TempSrc: Oral Oral Oral   SpO2: 99% 100% 98% 98%  Weight:      Height:         Intake/Output Summary (Last 24 hours) at 03/08/2024 1130 Last data filed at 03/08/2024 0900 Gross per 24 hour  Intake 1142.26 ml  Output --  Net 1142.26 ml     PHYSICAL EXAM General: Well-appearing male, well nourished, in no acute distress. HEENT: Normocephalic and atraumatic. Neck: No JVD.  Lungs: Normal respiratory effort on room air. Clear bilaterally to auscultation. No wheezes, crackles, rhonchi.  Heart: HRRR. Normal S1 and S2 without gallops or murmurs. Radial & DP pulses 2+ bilaterally. Abdomen: Non-distended appearing.  Msk: Normal strength  and tone for age. Extremities: No clubbing, cyanosis or edema.   Neuro: Alert and oriented X 3. Psych: Mood appropriate, affect congruent.    LABS: Basic Metabolic Panel: Recent Labs    03/07/24 0312 03/08/24 0414  NA 134* 135  K 3.9 4.1  CL 101 102  CO2 25 25  GLUCOSE 251* 223*  BUN 16 14  CREATININE 1.21 1.13  CALCIUM  8.9 9.0   Liver Function Tests: No results for input(s): AST, ALT, ALKPHOS, BILITOT, PROT, ALBUMIN in the last 72 hours. No results for input(s): LIPASE, AMYLASE in the last 72 hours. CBC: Recent Labs    03/07/24 0312 03/08/24 0414  WBC 9.5 7.9  HGB 12.8* 13.5  HCT 37.4* 39.5  MCV 83.7 84.0  PLT 226 247   Cardiac Enzymes: Recent Labs    03/05/24 2345 03/06/24 0248  TROPONINIHS 317* 392*   BNP: Recent Labs    03/05/24 2345  BNP 9.6   D-Dimer: No results for input(s): DDIMER in the last 72 hours. Hemoglobin A1C: Recent Labs    03/06/24 0248  HGBA1C 12.4*   Fasting Lipid Panel: No results for input(s): CHOL, HDL, LDLCALC, TRIG, CHOLHDL, LDLDIRECT in the last 72 hours. Thyroid Function Tests: No results for input(s): TSH, T4TOTAL, T3FREE, THYROIDAB in the last 72 hours.  Invalid input(s): FREET3 Anemia Panel: No results for input(s): VITAMINB12, FOLATE, FERRITIN, TIBC, IRON, RETICCTPCT in the last 72 hours.  No results found.    ECHO as above  TELEMETRY reviewed by me  03/08/24: Sinus rhythm rate 80s  EKG reviewed by me 03/08/24: Sinus rhythm rate 97 bpm, prolonged QT.  DATA reviewed by me 03/08/24: last 24h vitals tele labs imaging I/O, hospitalist progress note  Principal Problem:   Unstable angina (HCC) Active Problems:   Uncontrolled type 2 diabetes mellitus with hyperglycemia, without long-term current use of insulin  (HCC)   OSA (obstructive sleep apnea)   Chronic obstructive pulmonary disease (COPD) (HCC)   Obesity, Class III, BMI 40-49.9 (morbid obesity)    Intermittent claudication / Left SFA stenosis  (HCC)   HLD (hyperlipidemia)   Substance abuse (HCC)   Tobacco use disorder   NSTEMI (non-ST elevated myocardial infarction) (HCC)    ASSESSMENT AND PLAN: Greg Hernandez is a 63 y.o. male with a past medical history of hypertension, hyperlipidemia, peripheral artery disease, type 2 diabetes, COPD, tobacco use, cocaine use who presented to the ED on 03/05/2024 for elevated blood glucose, chest pain, shortness of breath.  Cardiology was consulted for further evaluation.  # Angina # Elevated troponin # Cardiomyopathy # Uncontrolled diabetes # Cocaine use Patient presented with complaints of significantly elevated blood glucose, chest pain, shortness of breath.  Blood glucose in the ED greater than 600.  Troponins checked and trended 317 > 392.  UDS was positive for cocaine.  EKG without acute ischemic changes.  Echo this admission revealed EF 35-40%, inferior/posterior hypokinesis, mild to moderate LV dilation, mild concentric LVH, grade 1 diastolic dysfunction, normal RV size and function. - Lipid panel pending. - Continue IV heparin  for 48 to 72 hours.   - Continue aspirin  81 mg daily, Crestor  40 mg daily. - Increased spironolactone  to 25 mg daily.   - Continue losartan  100 mg daily, metoprolol  succinate 25 mg daily.   - Ordered Farxiga  10 mg daily for GDMT optimization (Copay $4). - Further recommendations pending cardiac stress test results. Plan for day 2 of test tomorrow due to patients weight.  This patient's case was discussed and created with Dr. Florencio and he is in agreement.  Signed:  Chioke Noxon, PA-C  03/08/2024, 11:30 AM [facility]

## 2024-03-08 NOTE — Telephone Encounter (Signed)
 Patient Product/process development scientist completed.    The patient is insured through [facility] [place].     Ran test claim for Farxiga  10 mg and Requires Prior Authorization   This test claim was processed through [facility]- copay amounts may vary at other pharmacies due to [employer], or as the patient moves through the different stages of their insurance plan.     Reyes Sharps, CPHT Pharmacy Technician III Certified Patient Advocate [facility] Pharmacy Patient Advocate Team Direct Number: [phone]  Fax: [phone]

## 2024-03-08 NOTE — Inpatient Diabetes Management (Addendum)
 Inpatient Diabetes Program Recommendations  AACE/ADA: New Consensus Statement on Inpatient Glycemic Control (2015)  Target Ranges:  Prepandial:   less than 140 mg/dL      Peak postprandial:   less than 180 mg/dL (1-2 hours)      Critically ill patients:  140 - 180 mg/dL   Lab Results  Component Value Date   GLUCAP 247 (H) 03/08/2024   HGBA1C 12.4 (H) 03/06/2024    Diabetes history: DM2 Outpatient Diabetes medications: Ozempic 1 mg weekly Current orders for Inpatient glycemic control: Semglee  20 units daily, Novolog  0-20 units tid  correction  Inpatient Diabetes Program Recommendations:   Please consider: -Increase Semglee  to 25 units (0.2 units/kg x 138.8 kg = 27 units) -Add Novolog  4 units tid meal coverage if eats 50% meal  Reviewed A1c of 12.4 (average blood glucose 309 over the past 2-3 months). Patient has been drinking sugary drinks and not limiting his carbohydrates. Reviewed basic plate method with patient and wife and patient states he is willing to make changes in his diet to limit sugar and high carbs.  Ordered Living Well With Diabetes booklet and insulin  pen starter kit.  Educated patient and spouse on insulin  pen use at home. Reviewed contents of insulin  flexpen starter kit. Reviewed all steps if insulin  pen including attachment of needle, 2-unit air shot, dialing up dose, giving injection, removing needle, disposal of sharps, storage of unused insulin , disposal of insulin  etc. Patient and wife able to provide successful return demonstration. Also reviewed troubleshooting with insulin  pen. MD to give patient Rxs for insulin  pens and insulin  pen needles.  Placed D/C order set. Patient has been using his wife's glucose meter and needs his own.  Thank you, Aaliyah Cancro E. Sandie Swayze, RN, MSN, CDCES  Diabetes Coordinator Inpatient Glycemic Control Team Team Pager [phone] (8am-5pm) 03/08/2024 11:28 AM

## 2024-03-08 NOTE — Telephone Encounter (Signed)
 Pharmacy Patient Advocate Encounter   Received notification from Inpatient Request that prior authorization for FreeStyle Libre 3 Plus Sensor  is required/requested.   Insurance verification completed.   The patient is insured through [facility] [place] .   Per test claim: PA required; PA submitted to above mentioned insurance via Latent Key/confirmation #/EOC [id number] Status is pending

## 2024-03-08 NOTE — Progress Notes (Signed)
 Heart Failure Navigator Progress Note  Assessed for Heart & Vascular TOC clinic readiness.  Does not meet criteria due to current [facility] patient.  Navigator will sign off a this time.  Charmaine Pines, RN, BSN [facility] Heart Failure Navigator Secure Chat Only

## 2024-03-08 NOTE — Telephone Encounter (Signed)
 Patient Product/process development scientist completed.    The patient is insured through [facility] [place].     Ran test claim for Lantus  Pen and the current 30 day co-pay is $4.00.  Ran test claim for Novolog  FlexPen and the current 30 day co-pay is $4.00.  Ran test claim for [employer] 3 Plus Sensor and Requires Prior Authorization  This test claim was processed through [employer]- copay amounts may vary at other pharmacies due to [employer], or as the patient moves through the different stages of their insurance plan.     Reyes Sharps, CPHT Pharmacy Technician III Certified Patient Advocate [facility] Pharmacy Patient Advocate Team Direct Number: [phone]  Fax: [phone]

## 2024-03-08 NOTE — Progress Notes (Signed)
 PHARMACY - ANTICOAGULATION CONSULT NOTE  Pharmacy Consult for Heparin   Indication: chest pain/ACS  Allergies  Allergen Reactions   Metformin Diarrhea    Patient Measurements: Height: 5' 9 (175.3 cm) Weight: (!) 138.8 kg (306 lb) IBW/kg (Calculated) : 70.7 HEPARIN  DW (KG): 103.5 Heparin  DW = 103.5 kg   Vital Signs: Temp: 98.4 F (36.9 C) (08/18 0452) Temp Source: Oral (08/18 0452) BP: 127/93 (08/18 0452) Pulse Rate: 82 (08/18 0452)  Labs: Recent Labs    03/05/24 2345 03/06/24 0248 03/06/24 0401 03/06/24 1050 03/06/24 1717 03/07/24 0312 03/07/24 0907 03/08/24 0414  HGB 13.3  --   --  13.1  --  12.8*  --  13.5  HCT 39.3  --   --  40.1  --  37.4*  --  39.5  PLT 232  --   --  233  --  226  --  247  APTT  --   --  27  --   --   --   --   --   HEPARINUNFRC  --   --   --  0.18*   < > 0.43 0.59 0.55  CREATININE 1.56*  --   --  1.23  --  1.21  --   --   TROPONINIHS 317* 392*  --   --   --   --   --   --    < > = values in this interval not displayed.    Estimated Creatinine Clearance: 87.7 mL/min (by C-G formula based on SCr of 1.21 mg/dL).   Medical History: Past Medical History:  Diagnosis Date   Hypertension     Medications:   Assessment: Pharmacy consulted to dose heparin  in this 63 year old male admitted with ACS/NSTEMI.  No prior anticoag noted.  CrCl = 68 ml/min   8/16 1050 HL 0.18,  SUBtherapeutic 8/16 1717 HL <0.10, SUBtherapeutic 8/17 0312 HL 0.43,  Therapeutic X 1 8/17 0907 HL 0.59, therapeutic x 2 8/18 0414 HL 0.55, therapeutic x 3  Goal of Therapy:  Heparin  level 0.3-0.7 units/ml Monitor platelets by anticoagulation protocol: Yes   Plan:  8/18:  HL @ 0414 = 0.55, therapeutic X 3  - will continue pt on current rate of 2000 units/hr and recheck HL with am labs Continue to monitor H&H and platelets  Thank you for involving pharmacy in this patient's care.   Telia Amundson D Clinical Pharmacist 03/08/2024 5:20 AM

## 2024-03-09 ENCOUNTER — Inpatient Hospital Stay

## 2024-03-09 DIAGNOSIS — I200 Unstable angina: Secondary | ICD-10-CM | POA: Diagnosis not present

## 2024-03-09 LAB — LIPID PANEL
Cholesterol: 137 mg/dL (ref 0–200)
HDL: 33 mg/dL — ABNORMAL LOW (ref >40)
LDL Cholesterol: 76 mg/dL (ref 0–99)
Total CHOL/HDL Ratio: 4.2 ratio
Triglycerides: 142 mg/dL (ref <150)
VLDL: 28 mg/dL (ref 0–40)

## 2024-03-09 LAB — NM MYOCAR MULTI W/SPECT W/WALL MOTION / EF
Base ST Depression (mm): 0 mm
Estimated workload: 1
Exercise duration (min): 1 min
Exercise duration (sec): 0 s
LV dias vol: 202 mL (ref 62–150)
LV sys vol: 123 mL (ref 4.2–5.8)
MPHR: 158 {beats}/min
Nuc Stress EF: 39 %
Peak HR: 92 {beats}/min
Percent HR: 58 %
Rest BP: 85 mmHg
Rest HR: 1.8 {beats}/min
Rest Nuclear Isotope Dose: 33 mCi
SDS: 4
SRS: 1
SSS: 6
ST Depression (mm): 0 mm
Stress Nuclear Isotope Dose: 32.4 mCi
TID: 1.39

## 2024-03-09 LAB — HEPARIN LEVEL (UNFRACTIONATED)
Heparin Unfractionated: 0.58 [IU]/mL (ref 0.30–0.70)
Heparin Unfractionated: 0.5 [IU]/mL (ref 0.30–0.70)

## 2024-03-09 LAB — GLUCOSE, CAPILLARY
Glucose-Capillary: 147 mg/dL — ABNORMAL HIGH (ref 70–99)
Glucose-Capillary: 165 mg/dL — ABNORMAL HIGH (ref 70–99)
Glucose-Capillary: 142 mg/dL — ABNORMAL HIGH (ref 70–99)
Glucose-Capillary: 160 mg/dL — ABNORMAL HIGH (ref 70–99)

## 2024-03-09 LAB — CBC
HCT: 42.1 % (ref 39.0–52.0)
Hemoglobin: 13.9 g/dL (ref 13.0–17.0)
MCH: 28.2 pg (ref 26.0–34.0)
MCHC: 33 g/dL (ref 30.0–36.0)
MCV: 85.4 fL (ref 80.0–100.0)
Platelets: 249 K/uL (ref 150–400)
RBC: 4.93 MIL/uL (ref 4.22–5.81)
RDW: 14.1 % (ref 11.5–15.5)
WBC: 10.1 K/uL (ref 4.0–10.5)
nRBC: 0 % (ref 0.0–0.2)

## 2024-03-09 MED ORDER — ENOXAPARIN SODIUM 80 MG/0.8ML IJ SOSY: 0.5000 mg/kg | PREFILLED_SYRINGE | INTRAMUSCULAR | Status: DC

## 2024-03-09 MED ORDER — TECHNETIUM TC 99M TETROFOSMIN IV KIT
32.3900 | PACK | Freq: Once | INTRAVENOUS | Status: AC | PRN
  Administered 2024-03-09: 33.04 via INTRAVENOUS

## 2024-03-09 MED ORDER — ORAL CARE MOUTH RINSE: 15.0000 mL | OROMUCOSAL | Status: DC | PRN

## 2024-03-09 MED ORDER — HEPARIN (PORCINE) 25000 UT/250ML-% IV SOLN
2000.0000 [IU]/h | INTRAVENOUS | Status: DC
  Administered 2024-03-09 – 2024-03-10 (×2): 2000 [IU]/h via INTRAVENOUS
  Filled 2024-03-09 (×2): qty 250

## 2024-03-09 MED ORDER — HEPARIN BOLUS VIA INFUSION
4000.0000 [IU] | Freq: Once | INTRAVENOUS | Status: AC
  Administered 2024-03-09: 4000 [IU] via INTRAVENOUS
  Filled 2024-03-09: qty 4000

## 2024-03-09 NOTE — Progress Notes (Signed)
 PHARMACY - ANTICOAGULATION CONSULT NOTE  Pharmacy Consult for Heparin   Indication: chest pain/ACS  Allergies  Allergen Reactions   Metformin Diarrhea    Patient Measurements: Height: 5' 9 (175.3 cm) Weight: (!) 138.8 kg (306 lb) IBW/kg (Calculated) : 70.7 HEPARIN  DW (KG): 103.5 Heparin  DW = 103.5 kg   Vital Signs: Temp: 97.8 F (36.6 C) (08/19 1200) Temp Source: Oral (08/19 0426) BP: 116/85 (08/19 1200) Pulse Rate: 90 (08/19 0931)  Labs: Recent Labs    03/07/24 0312 03/07/24 0907 03/08/24 0414 03/09/24 0418  HGB 12.8*  --  13.5 13.9  HCT 37.4*  --  39.5 42.1  PLT 226  --  247 249  HEPARINUNFRC 0.43 0.59 0.55 0.58  CREATININE 1.21  --  1.13  --     Estimated Creatinine Clearance: 93.9 mL/min (by C-G formula based on SCr of 1.13 mg/dL).   Medical History: Past Medical History:  Diagnosis Date   Hypertension     Medications:   Assessment: Pharmacy consulted to dose heparin  in this 63 year old male admitted with ACS/NSTEMI.  No prior anticoag noted.  CrCl = 68 ml/min  On 8/19, patient underwent stress test with abnormal results, so now patient is scheduled for [facility] tomorrow afternoon, 8/20. Pharmacy has been consulted to resume and manage heparin  in the meantime.  8/16 1050 HL 0.18,  SUBtherapeutic 8/16 1717 HL <0.10, SUBtherapeutic 8/17 0312 HL 0.43,  Therapeutic X 1 8/17 0907 HL 0.59, therapeutic x 2 8/18 0414 HL 0.55, therapeutic x 3 8/19 0418 HL 0.58, therapeutic x 4 8/19 1220 ----------- Heparin  drip stopped   Goal of Therapy:  Heparin  level 0.3-0.7 units/ml Monitor platelets by anticoagulation protocol: Yes   Plan: Bolus 4000 units and resume heparin  at 2000 units/hr Check heparin  level in 6 hours after restart Monitor CBC daily while on heparin  infusion  Thank you for involving pharmacy in this patient's care.   Will M. Lenon, PharmD Clinical Pharmacist 03/09/2024 4:31 PM

## 2024-03-09 NOTE — Progress Notes (Addendum)
 0830 Down to Nuclear Medicine for test.Not given 0800 insulin . Patient has been NPO all night.

## 2024-03-09 NOTE — TOC CM/SW Note (Signed)
 Transition of Care [facility]) - Inpatient Brief Assessment   Patient Details  Name: Greg Hernandez MRN: [id number] Date of Birth: [birth year]  Transition of Care [facility]) CM/SW Contact:    Lauraine JAYSON Carpen, LCSW Phone Number: 03/09/2024, 2:41 PM   Clinical Narrative: CSW reviewed chart. No TOC needs identified so far. CSW will continue to follow progress. Please place [facility] consult if any needs arise.  Transition of Care Asessment: Insurance and Status: Insurance coverage has been reviewed Patient has primary care physician: Yes Home environment has been reviewed: Single family home Prior level of function:: Not documented Prior/Current Home Services: No current home services Social Drivers of Health Review: SDOH reviewed no interventions necessary Readmission risk has been reviewed: Yes Transition of care needs: no transition of care needs at this time

## 2024-03-09 NOTE — Progress Notes (Signed)
 PHARMACY - ANTICOAGULATION CONSULT NOTE  Pharmacy Consult for Heparin   Indication: chest pain/ACS  Allergies  Allergen Reactions   Metformin Diarrhea    Patient Measurements: Height: 5' 9 (175.3 cm) Weight: (!) 138.8 kg (306 lb) IBW/kg (Calculated) : 70.7 HEPARIN  DW (KG): 103.5 Heparin  DW = 103.5 kg   Vital Signs: Temp: 97.8 F (36.6 C) (08/19 0426) Temp Source: Oral (08/19 0426) BP: 125/77 (08/19 0426) Pulse Rate: 87 (08/19 0426)  Labs: Recent Labs    03/06/24 1050 03/06/24 1717 03/07/24 0312 03/07/24 0907 03/08/24 0414 03/09/24 0418  HGB 13.1  --  12.8*  --  13.5 13.9  HCT 40.1  --  37.4*  --  39.5 42.1  PLT 233  --  226  --  247 249  HEPARINUNFRC 0.18*   < > 0.43 0.59 0.55 0.58  CREATININE 1.23  --  1.21  --  1.13  --    < > = values in this interval not displayed.    Estimated Creatinine Clearance: 93.9 mL/min (by C-G formula based on SCr of 1.13 mg/dL).   Medical History: Past Medical History:  Diagnosis Date   Hypertension     Medications:   Assessment: Pharmacy consulted to dose heparin  in this 63 year old male admitted with ACS/NSTEMI.  No prior anticoag noted.  CrCl = 68 ml/min   8/16 1050 HL 0.18,  SUBtherapeutic 8/16 1717 HL <0.10, SUBtherapeutic 8/17 0312 HL 0.43,  Therapeutic X 1 8/17 0907 HL 0.59, therapeutic x 2 8/18 0414 HL 0.55, therapeutic x 3 8/19 0418 HL 0.58, therapeutic x 4  Goal of Therapy:  Heparin  level 0.3-0.7 units/ml Monitor platelets by anticoagulation protocol: Yes   Plan:  - will continue pt on current rate of 2000 units/hr and recheck HL with am labs - Continue to monitor H&H and platelets  Thank you for involving pharmacy in this patient's care.   Rankin CANDIE Dills, PharmD, [facility] 03/09/2024 6:22 AM

## 2024-03-09 NOTE — Progress Notes (Cosign Needed Addendum)
 [facility] CLINIC CARDIOLOGY PROGRESS NOTE   Patient ID: Greg Hernandez MRN: [id number] DOB/AGE: 1961-05-12 63 y.o.  Admit date: 03/05/2024 Referring Physician Dr. Delayne Solian Primary Physician Perri, Mychal Burnard, PA-C  Primary Cardiologist Dr. Margery Ruth [facility]) Reason for Consultation chest pain  HPI: Greg Hernandez is a 63 y.o. male with a past medical history of hypertension, hyperlipidemia, peripheral artery disease, type 2 diabetes, COPD, OSA (on  CPAP), tobacco use, cocaine use who presented to the ED on 03/05/2024 for elevated blood glucose, chest pain, shortness of breath.  Cardiology was consulted for further evaluation.  Interval History:  - Patient seen and examined this morning, resting comfortably in hospital bed. - States that he is feeling good overall today.  Denies any recurrence of chest pain or shortness of breath. - BP and heart rate remain well-controlled.  No events on telemetry noted.   - Plan for day 2 of cardiac stress test. Patient has remained NPO.  Review of systems complete and found to be negative unless listed above   Vitals:   03/08/24 1530 03/08/24 1926 03/08/24 2244 03/09/24 0426  BP: 103/61 119/61 117/70 125/77  Pulse: 84 85 85 87  Resp: 16 (!) 22 18 19   Temp: 97.6 F (36.4 C) 98.2 F (36.8 C) 98.2 F (36.8 C) 97.8 F (36.6 C)  TempSrc:    Oral  SpO2: 96% 96% 98% 99%  Weight:      Height:         Intake/Output Summary (Last 24 hours) at 03/09/2024 9281 Last data filed at 03/09/2024 0359 Gross per 24 hour  Intake 1401.74 ml  Output --  Net 1401.74 ml     PHYSICAL EXAM General: Well-appearing male, well nourished, in no acute distress. HEENT: Normocephalic and atraumatic. Neck: No JVD.  Lungs: Normal respiratory effort on room air. Clear bilaterally to auscultation. No wheezes, crackles, rhonchi.  Heart: HRRR. Normal S1 and S2 without gallops or murmurs. Radial & DP pulses 2+ bilaterally. Abdomen: Non-distended appearing.  Msk:  Normal strength and tone for age. Extremities: No clubbing, cyanosis or edema.   Neuro: Alert and oriented X 3. Psych: Mood appropriate, affect congruent.    LABS: Basic Metabolic Panel: Recent Labs    03/07/24 0312 03/08/24 0414  NA 134* 135  K 3.9 4.1  CL 101 102  CO2 25 25  GLUCOSE 251* 223*  BUN 16 14  CREATININE 1.21 1.13  CALCIUM  8.9 9.0   Liver Function Tests: No results for input(s): AST, ALT, ALKPHOS, BILITOT, PROT, ALBUMIN in the last 72 hours. No results for input(s): LIPASE, AMYLASE in the last 72 hours. CBC: Recent Labs    03/08/24 0414 03/09/24 0418  WBC 7.9 10.1  HGB 13.5 13.9  HCT 39.5 42.1  MCV 84.0 85.4  PLT 247 249   Cardiac Enzymes: No results for input(s): CKTOTAL, CKMB, CKMBINDEX, TROPONINIHS in the last 72 hours.  BNP: No results for input(s): BNP in the last 72 hours.  D-Dimer: No results for input(s): DDIMER in the last 72 hours. Hemoglobin A1C: No results for input(s): HGBA1C in the last 72 hours.  Fasting Lipid Panel: Recent Labs    03/09/24 0418  CHOL 137  HDL 33*  LDLCALC 76  TRIG 857  CHOLHDL 4.2   Thyroid Function Tests: No results for input(s): TSH, T4TOTAL, T3FREE, THYROIDAB in the last 72 hours.  Invalid input(s): FREET3 Anemia Panel: No results for input(s): VITAMINB12, FOLATE, FERRITIN, TIBC, IRON, RETICCTPCT in the last 72 hours.  No  results found.    ECHO as above  TELEMETRY reviewed by me 03/09/24: Sinus rhythm rate 80s    EKG reviewed by me 03/09/24: Sinus rhythm rate 97 bpm, prolonged QT.  DATA reviewed by me 03/09/24: last 24h vitals tele labs imaging I/O, hospitalist progress note  Principal Problem:   Unstable angina (HCC) Active Problems:   Uncontrolled type 2 diabetes mellitus with hyperglycemia, without long-term current use of insulin  (HCC)   OSA (obstructive sleep apnea)   Chronic obstructive pulmonary disease (COPD) (HCC)   Obesity,  Class III, BMI 40-49.9 (morbid obesity)   Intermittent claudication / Left SFA stenosis  (HCC)   HLD (hyperlipidemia)   Substance abuse (HCC)   Tobacco use disorder   NSTEMI (non-ST elevated myocardial infarction) (HCC)    ASSESSMENT AND PLAN: Greg Hernandez is a 63 y.o. male with a past medical history of hypertension, hyperlipidemia, peripheral artery disease, type 2 diabetes, COPD, OSA (on  CPAP), tobacco use, cocaine use who presented to the ED on 03/05/2024 for elevated blood glucose, chest pain, shortness of breath.  Cardiology was consulted for further evaluation.  # Angina # Elevated troponin # Abnormal cardiac stress test # Cardiomyopathy # Uncontrolled diabetes # Cocaine use Patient presented with complaints of significantly elevated blood glucose, chest pain, shortness of breath.  Blood glucose in the ED greater than 600.  Troponins checked and trended 317 > 392.  UDS was positive for cocaine.  EKG without acute ischemic changes.  Echo this admission revealed EF 35-40%, inferior/posterior hypokinesis, mild to moderate LV dilation, mild concentric LVH, grade 1 diastolic dysfunction, normal RV size and function. LDL this admisison 76. Cardiac stress test high risk study revealing ischemia, apical inferior location that is partially reversible.  - Continue IV heparin  gtt. - Continue aspirin  81 mg daily, Crestor  40 mg daily. - Continue spironolactone  to 25 mg daily.   - Continue losartan  100 mg daily, metoprolol  succinate 25 mg daily.   - Continue Farxiga  10 mg daily for GDMT optimization (Copay $4). - Plan for LHC with Dr. Florencio tomorrow at 1:30 pm. NPO on 08/20 at 0630.  -Discussed the risks and benefits of proceeding with LHC for further evaluation with the patient.  He is agreeable to proceed.  Scheduled with  Dr. Florencio on 08/19 at 1:30 PM.  Written consent will be obtained.  Further recommendations following LHC.    This patient's case was discussed and created with Dr.  Florencio and he is in agreement.  Signed:  Dorene Comfort, PA-C  03/09/2024, 7:18 AM [facility] Cardiology

## 2024-03-09 NOTE — Plan of Care (Signed)

## 2024-03-09 NOTE — Progress Notes (Signed)
 PHARMACY - ANTICOAGULATION CONSULT NOTE  Pharmacy Consult for Heparin   Indication: chest pain/ACS  Allergies  Allergen Reactions   Metformin Diarrhea    Patient Measurements: Height: 5' 9 (175.3 cm) Weight: (!) 138.8 kg (306 lb) IBW/kg (Calculated) : 70.7 HEPARIN  DW (KG): 103.5 Heparin  DW = 103.5 kg   Vital Signs: Temp: 97.6 F (36.4 C) (08/19 2330) BP: 130/64 (08/19 2330) Pulse Rate: 89 (08/19 2330)  Labs: Recent Labs    03/07/24 0312 03/07/24 0907 03/08/24 0414 03/09/24 0418 03/09/24 2248  HGB 12.8*  --  13.5 13.9  --   HCT 37.4*  --  39.5 42.1  --   PLT 226  --  247 249  --   HEPARINUNFRC 0.43   < > 0.55 0.58 0.50  CREATININE 1.21  --  1.13  --   --    < > = values in this interval not displayed.    Estimated Creatinine Clearance: 93.9 mL/min (by C-G formula based on SCr of 1.13 mg/dL).   Medical History: Past Medical History:  Diagnosis Date   Hypertension     Medications:   Assessment: Pharmacy consulted to dose heparin  in this 63 year old male admitted with ACS/NSTEMI.  No prior anticoag noted.  CrCl = 68 ml/min  On 8/19, patient underwent stress test with abnormal results, so now patient is scheduled for [facility] tomorrow afternoon, 8/20. Pharmacy has been consulted to resume and manage heparin  in the meantime.  8/16 1050 HL 0.18,  SUBtherapeutic 8/16 1717 HL <0.10, SUBtherapeutic 8/17 0312 HL 0.43,  Therapeutic X 1 8/17 0907 HL 0.59, therapeutic x 2 8/18 0414 HL 0.55, therapeutic x 3 8/19 0418 HL 0.58, therapeutic x 4 8/19 1220 ----------- Heparin  drip stopped 8/19 2248 HL 0.50, therapeutic x 1 after restart   Goal of Therapy:  Heparin  level 0.3-0.7 units/ml Monitor platelets by anticoagulation protocol: Yes   Plan: Continue heparin  infusion at 2000 units/hr Check heparin  level in 6 hours to confirm Monitor CBC daily while on heparin  infusion  Thank you for involving pharmacy in this patient's care.   Rankin CANDIE Dills, PharmD,  [facility] 03/09/2024 11:53 PM

## 2024-03-09 NOTE — Progress Notes (Signed)
 Progress Note   Patient: Greg Hernandez FMW:[id number] DOB: 01-Sep-1960 DOA: 03/05/2024     2 DOS: the patient was seen and examined on 03/09/2024   Brief hospital course: 63yo with h/o HTN, PAD, HLD, COPD, DM, tobacco use, cocaine use, and morbid obesity who presented on 8/16 with CP/SOB. Troponin elevated. C/W NSTEMI, started on Heparin . Echo with acute systolic dysfunction, EF 35-40% with WMA and grade 1 DD, consistent with MI. Cardiology consulting, cath Monday.  Assessment and Plan:  NSTEMI Patient presented with CP and DOE Heparin  infusion x 48-72 hours Aspirin , continue home rosuvastatin  and losartan  Started on Toprol  XL Nitroglycerin  sublingual as needed chest pain with morphine  for breakthrough Echocardiogram done and c/w NSTEMI Given WMA on echo, he appears to be at high risk for needing cath Cardiology consulted Did a 2-day stress test, which is now positive Cardiology has added spironolactone  and Farxiga  Now is planned for cath on 8/20   Uncontrolled type 2 diabetes mellitus with hyperglycemia, without long-term current use of insulin  A1c 12.4, very poor control Received IV insulin  and fluids in the ED Started on basal insulin , likely to need as an outpatient Sliding scale coverage   HLD Lipid panel with LDL 76 Started on atorvastatin   Intermittent claudication / Left SFA stenosis / HLD Has seen vascular in the past March 2025 L SFA stenosis of 50-75%, R ABI 0.91 with TP 58 and L ABI 0.8 with TP 52  Continue aspirin  and rosuvastatin     Chronic obstructive pulmonary disease (COPD) with ongoing tobacco use Cessation encouraged Evaluated by pulmonology in July 2025 Continue Advair (Breo per formulary), SIngulair  Albuterol  nebs as needed   HTN Continue losartan  Started on Toprol  XL, spironolactone  Nicotine  patch provided   Obstructive sleep apnea Continue CPAP   Obesity, Class III, BMI 40-49.9 (morbid obesity) Body mass index is 45.19 kg/m.SABRA  Weight  loss should be encouraged on an ongoing basis Hold Ozempic for now Outpatient PCP/bariatric medicine f/u encouraged Significantly low or high BMI is associated with higher medical risk including morbidity and mortality    Substance abuse  UDS + cocaine Extensive counseling on need for complete abstinence provided at the time of admission Patient seems very motivated to quit         Consultants: Cardiology   Procedures: Echocardiogram 8/16   Antibiotics: None      30 Day Unplanned Readmission Risk Score    Flowsheet Row ED to Hosp-Admission (Current) from 03/05/2024 in [facility] CARDIAC MED PCU  30 Day Unplanned Readmission Risk Score (%) 13.82 Filed at 03/09/2024 0801    This score is the patient's risk of an unplanned readmission within 30 days of being discharged (0 -100%). The score is based on dignosis, age, lab data, medications, orders, and past utilization.   Low:  0-14.9   Medium: 15-21.9   High: 22-29.9   Extreme: 30 and above           Subjective: Feels good, understands the need to have cath tomorrow if stress test is positive (it is).   Objective: Vitals:   03/09/24 0927 03/09/24 0931  BP: 126/67 126/77  Pulse: 85 90  Resp:    Temp:    SpO2: 97%     Intake/Output Summary (Last 24 hours) at 03/09/2024 1405 Last data filed at 03/09/2024 1300 Gross per 24 hour  Intake 1219.48 ml  Output --  Net 1219.48 ml   Filed Weights   03/05/24 2333 03/05/24 2343  Weight: (!) 138.8 kg ROLLEN)  138.8 kg    Exam:  General:  Appears calm and comfortable and is in NAD Eyes:  normal lids, iris ENT:  grossly normal hearing, lips & tongue, mmm Cardiovascular:  RRR, no m/r/g. No LE edema.  Respiratory:   CTA bilaterally with no wheezes/rales/rhonchi.  Normal respiratory effort. Abdomen:  soft, NT, ND Skin:  no rash or induration seen on limited exam Musculoskeletal:  grossly normal tone BUE/BLE, good ROM, no bony abnormality Psychiatric:  grossly normal  mood and affect, speech fluent and appropriate, AOx3 Neurologic:  CN 2-12 grossly intact, moves all extremities in coordinated fashion  Data Reviewed: I have reviewed the patient's lab results since admission.  Pertinent labs for today include:   Lipids: 137/33/76/142 Normal CBC     Family Communication: Family present  Disposition: Status is: Inpatient Remains inpatient appropriate because: needs cath     Time spent: 50 minutes  Unresulted Labs (From admission, onward)     Start     Ordered   03/10/24 0500  CBC  Tomorrow morning,   R       Question:  Specimen collection method  Answer:  Lab=Lab collect   03/09/24 0622   03/10/24 0500  Basic metabolic panel with GFR  Tomorrow morning,   R       Question:  Specimen collection method  Answer:  Lab=Lab collect   03/09/24 1405             Author: Delon Herald, MD 03/09/2024 2:05 PM  For on call review [email or web address].

## 2024-03-10 ENCOUNTER — Encounter
Admission: EM | Disposition: A | Discharge status: Home / Self Care | Payer: Self-pay | Source: Home / Self Care | Attending: Internal Medicine

## 2024-03-10 DIAGNOSIS — E1165 Type 2 diabetes mellitus with hyperglycemia: Secondary | ICD-10-CM | POA: Diagnosis not present

## 2024-03-10 DIAGNOSIS — E66813 Obesity, class 3: Secondary | ICD-10-CM

## 2024-03-10 DIAGNOSIS — I214 Non-ST elevation (NSTEMI) myocardial infarction: Secondary | ICD-10-CM | POA: Diagnosis not present

## 2024-03-10 HISTORY — PX: LEFT HEART CATH AND CORONARY ANGIOGRAPHY: CATH118249

## 2024-03-10 LAB — CBC
HCT: 42.9 % (ref 39.0–52.0)
Hemoglobin: 14.1 g/dL (ref 13.0–17.0)
MCH: 28.3 pg (ref 26.0–34.0)
MCHC: 32.9 g/dL (ref 30.0–36.0)
MCV: 86.1 fL (ref 80.0–100.0)
Platelets: 266 K/uL (ref 150–400)
RBC: 4.98 MIL/uL (ref 4.22–5.81)
RDW: 14.2 % (ref 11.5–15.5)
WBC: 9.6 K/uL (ref 4.0–10.5)
nRBC: 0 % (ref 0.0–0.2)

## 2024-03-10 LAB — BASIC METABOLIC PANEL WITH GFR
Anion gap: 12 (ref 5–15)
BUN: 21 mg/dL (ref 8–23)
CO2: 24 mmol/L (ref 22–32)
Calcium: 9.6 mg/dL (ref 8.9–10.3)
Chloride: 101 mmol/L (ref 98–111)
Creatinine, Ser: 1.6 mg/dL — ABNORMAL HIGH (ref 0.61–1.24)
GFR, Estimated: 48 mL/min — ABNORMAL LOW (ref >60)
Glucose, Bld: 152 mg/dL — ABNORMAL HIGH (ref 70–99)
Potassium: 3.9 mmol/L (ref 3.5–5.1)
Sodium: 137 mmol/L (ref 135–145)

## 2024-03-10 LAB — GLUCOSE, CAPILLARY
Glucose-Capillary: 167 mg/dL — ABNORMAL HIGH (ref 70–99)
Glucose-Capillary: 119 mg/dL — ABNORMAL HIGH (ref 70–99)
Glucose-Capillary: 106 mg/dL — ABNORMAL HIGH (ref 70–99)
Glucose-Capillary: 189 mg/dL — ABNORMAL HIGH (ref 70–99)

## 2024-03-10 LAB — CREATININE, SERUM
Creatinine, Ser: 1.49 mg/dL — ABNORMAL HIGH (ref 0.61–1.24)
GFR, Estimated: 53 mL/min — ABNORMAL LOW (ref >60)

## 2024-03-10 LAB — HEPARIN LEVEL (UNFRACTIONATED): Heparin Unfractionated: 0.49 [IU]/mL (ref 0.30–0.70)

## 2024-03-10 SURGERY — LEFT HEART CATH AND CORONARY ANGIOGRAPHY
Anesthesia: Moderate Sedation

## 2024-03-10 MED ORDER — FENTANYL CITRATE (PF) 100 MCG/2ML IJ SOLN
INTRAMUSCULAR | Status: AC
  Filled 2024-03-10: qty 2

## 2024-03-10 MED ORDER — SODIUM CHLORIDE 0.9 % IV SOLN: 250.0000 mL | INTRAVENOUS | Status: DC | PRN

## 2024-03-10 MED ORDER — LIDOCAINE HCL (PF) 1 % IJ SOLN
INTRAMUSCULAR | Status: DC | PRN
Start: 2024-03-10 — End: 2024-03-10
  Administered 2024-03-10: 5 mL

## 2024-03-10 MED ORDER — SODIUM CHLORIDE 0.9 % IV SOLN: INTRAVENOUS | Status: AC

## 2024-03-10 MED ORDER — SPIRONOLACTONE 25 MG PO TABS
25.0000 mg | ORAL_TABLET | Freq: Every day | ORAL | Status: DC
  Administered 2024-03-11: 25 mg via ORAL
  Filled 2024-03-10: qty 1

## 2024-03-10 MED ORDER — SODIUM CHLORIDE 0.9% FLUSH: 3.0000 mL | INTRAVENOUS | Status: DC | PRN

## 2024-03-10 MED ORDER — MIDAZOLAM HCL 2 MG/2ML IJ SOLN
INTRAMUSCULAR | Status: AC
  Filled 2024-03-10: qty 2

## 2024-03-10 MED ORDER — HEPARIN (PORCINE) IN NACL 1000-0.9 UT/500ML-% IV SOLN
INTRAVENOUS | Status: DC | PRN
Start: 2024-03-10 — End: 2024-03-10
  Administered 2024-03-10 (×2): 500 mL

## 2024-03-10 MED ORDER — HYDRALAZINE HCL 20 MG/ML IJ SOLN: 10.0000 mg | INTRAMUSCULAR | Status: AC | PRN

## 2024-03-10 MED ORDER — LIDOCAINE HCL 1 % IJ SOLN
INTRAMUSCULAR | Status: AC
  Filled 2024-03-10: qty 20

## 2024-03-10 MED ORDER — ASPIRIN 81 MG PO CHEW: 81.0000 mg | CHEWABLE_TABLET | ORAL | Status: DC

## 2024-03-10 MED ORDER — FENTANYL CITRATE (PF) 100 MCG/2ML IJ SOLN
INTRAMUSCULAR | Status: DC | PRN
  Administered 2024-03-10: 50 ug via INTRAVENOUS

## 2024-03-10 MED ORDER — IOHEXOL 300 MG/ML  SOLN
INTRAMUSCULAR | Status: DC | PRN
  Administered 2024-03-10: 72 mL

## 2024-03-10 MED ORDER — SODIUM CHLORIDE 0.9% FLUSH
3.0000 mL | Freq: Two times a day (BID) | INTRAVENOUS | Status: DC
  Administered 2024-03-11: 3 mL via INTRAVENOUS

## 2024-03-10 MED ORDER — MIDAZOLAM HCL 2 MG/2ML IJ SOLN
INTRAMUSCULAR | Status: DC | PRN
Start: 2024-03-10 — End: 2024-03-10
  Administered 2024-03-10: 1 mg via INTRAVENOUS

## 2024-03-10 MED ORDER — HEPARIN SODIUM (PORCINE) 1000 UNIT/ML IJ SOLN
INTRAMUSCULAR | Status: AC
  Filled 2024-03-10: qty 10

## 2024-03-10 MED ORDER — FREE WATER: 250.0000 mL | Freq: Once | Status: DC

## 2024-03-10 MED ORDER — LOSARTAN POTASSIUM 50 MG PO TABS
100.0000 mg | ORAL_TABLET | Freq: Every day | ORAL | Status: DC
  Administered 2024-03-11: 100 mg via ORAL
  Filled 2024-03-10: qty 2

## 2024-03-10 MED ORDER — INSULIN GLARGINE 100 UNIT/ML ~~LOC~~ SOLN
20.0000 [IU] | Freq: Every day | SUBCUTANEOUS | Status: DC
  Administered 2024-03-10: 20 [IU] via SUBCUTANEOUS
  Filled 2024-03-10: qty 0.2

## 2024-03-10 MED ORDER — VERAPAMIL HCL 2.5 MG/ML IV SOLN
INTRAVENOUS | Status: AC
  Filled 2024-03-10: qty 2

## 2024-03-10 MED ORDER — HEPARIN SODIUM (PORCINE) 1000 UNIT/ML IJ SOLN
INTRAMUSCULAR | Status: DC | PRN
  Administered 2024-03-10: 4000 [IU] via INTRAVENOUS

## 2024-03-10 MED ORDER — EZETIMIBE 10 MG PO TABS
10.0000 mg | ORAL_TABLET | Freq: Every day | ORAL | Status: DC
  Administered 2024-03-10 – 2024-03-11 (×2): 10 mg via ORAL
  Filled 2024-03-10 (×2): qty 1

## 2024-03-10 MED ORDER — HEPARIN (PORCINE) IN NACL 1000-0.9 UT/500ML-% IV SOLN
INTRAVENOUS | Status: AC
  Filled 2024-03-10: qty 1000

## 2024-03-10 MED ORDER — ACETAMINOPHEN 325 MG PO TABS: 650.0000 mg | ORAL_TABLET | ORAL | Status: DC | PRN

## 2024-03-10 MED ORDER — ONDANSETRON HCL 4 MG/2ML IJ SOLN: 4.0000 mg | Freq: Four times a day (QID) | INTRAMUSCULAR | Status: DC | PRN

## 2024-03-10 MED ORDER — VERAPAMIL HCL 2.5 MG/ML IV SOLN
INTRAVENOUS | Status: DC | PRN
  Administered 2024-03-10: 2.5 mg via INTRA_ARTERIAL

## 2024-03-10 MED ORDER — DAPAGLIFLOZIN PROPANEDIOL 10 MG PO TABS
10.0000 mg | ORAL_TABLET | Freq: Every day | ORAL | Status: DC
  Administered 2024-03-11: 10 mg via ORAL
  Filled 2024-03-10: qty 1

## 2024-03-10 SURGICAL SUPPLY — 9 items
CATH INFINITI 5 FR JL3.5 (CATHETERS) IMPLANT
CATH INFINITI JR4 5F (CATHETERS) IMPLANT
DEVICE RAD TR BAND REGULAR (VASCULAR PRODUCTS) IMPLANT
DRAPE BRACHIAL (DRAPES) IMPLANT
GLIDESHEATH SLEND SS 6F .021 (SHEATH) IMPLANT
GUIDEWIRE INQWIRE 1.5J.035X260 (WIRE) IMPLANT
PACK CARDIAC CATH (CUSTOM PROCEDURE TRAY) ×1 IMPLANT
SET ATX-X65L (MISCELLANEOUS) IMPLANT
STATION PROTECTION PRESSURIZED (MISCELLANEOUS) IMPLANT

## 2024-03-10 NOTE — Progress Notes (Signed)
 PHARMACY - ANTICOAGULATION CONSULT NOTE  Pharmacy Consult for Heparin   Indication: chest pain/ACS  Allergies  Allergen Reactions   Metformin Diarrhea    Patient Measurements: Height: 5' 9 (175.3 cm) Weight: (!) 138.8 kg (306 lb) IBW/kg (Calculated) : 70.7 HEPARIN  DW (KG): 103.5 Heparin  DW = 103.5 kg   Vital Signs: Temp: 98.3 F (36.8 C) (08/20 0433) BP: 135/58 (08/20 0433) Pulse Rate: 91 (08/20 0433)  Labs: Recent Labs    03/08/24 0414 03/09/24 0418 03/09/24 2248 03/10/24 0516  HGB 13.5 13.9  --  14.1  HCT 39.5 42.1  --  42.9  PLT 247 249  --  266  HEPARINUNFRC 0.55 0.58 0.50 0.49  CREATININE 1.13  --   --   --     Estimated Creatinine Clearance: 93.9 mL/min (by C-G formula based on SCr of 1.13 mg/dL).   Medical History: Past Medical History:  Diagnosis Date   Hypertension     Medications:   Assessment: Pharmacy consulted to dose heparin  in this 63 year old male admitted with ACS/NSTEMI.  No prior anticoag noted.  CrCl = 68 ml/min  On 8/19, patient underwent stress test with abnormal results, so now patient is scheduled for [facility] tomorrow afternoon, 8/20. Pharmacy has been consulted to resume and manage heparin  in the meantime.  8/16 1050 HL 0.18,  SUBtherapeutic 8/16 1717 HL <0.10, SUBtherapeutic 8/17 0312 HL 0.43,  Therapeutic X 1 8/17 0907 HL 0.59, therapeutic x 2 8/18 0414 HL 0.55, therapeutic x 3 8/19 0418 HL 0.58, therapeutic x 4 8/19 1220 ----------- Heparin  drip stopped 8/19 2248 HL 0.50, therapeutic x 1 after restart 8/20 0516 HL 0.49, therapeutic x 2   Goal of Therapy:  Heparin  level 0.3-0.7 units/ml Monitor platelets by anticoagulation protocol: Yes   Plan: Continue heparin  infusion at 2000 units/hr Recheck HL daily w/ AM labs while therapeutic Monitor CBC daily while on heparin  infusion  Thank you for involving pharmacy in this patient's care.   Rankin CANDIE Dills, PharmD, [facility] 03/10/2024 5:53 AM

## 2024-03-10 NOTE — Progress Notes (Addendum)
 [facility] CARDIOLOGY PROGRESS NOTE   Patient ID: Greg Hernandez MRN: [id number] DOB/AGE: 62-Jan-1962 62 y.o.  Admit date: 03/05/2024 Referring Physician Dr. Delayne Solian Primary Physician Perri, Mychal Burnard, PA-C  Primary Cardiologist Dr. Margery Ruth [facility]) Reason for Consultation chest pain  HPI: Greg Hernandez is a 63 y.o. male with a past medical history of hypertension, hyperlipidemia, peripheral artery disease, type 2 diabetes, COPD, OSA (on  CPAP), tobacco use, cocaine use who presented to the ED on 03/05/2024 for elevated blood glucose, chest pain, shortness of breath.  Cardiology was consulted for further evaluation.  Interval History:  - Patient seen and examined this morning, resting comfortably in hospital bed. - States that he is feeling good overall today.  Denies any recurrence of chest pain or shortness of breath. - BP and heart rate remain well-controlled.  No events on telemetry noted.   - Plan for LHC later today.  Creatinine elevated yesterday so we will prehydrate with IV fluids.  Review of systems complete and found to be negative unless listed above   Vitals:   03/09/24 2330 03/10/24 0433 03/10/24 0500 03/10/24 0900  BP: 130/64 (!) 135/58  131/63  Pulse: 89 91  82  Resp: 18 18  18   Temp: 97.6 F (36.4 C) 98.3 F (36.8 C)  98.1 F (36.7 C)  TempSrc:      SpO2: 100% 96%  98%  Weight:   (!) 138.8 kg   Height:         Intake/Output Summary (Last 24 hours) at 03/10/2024 9060 Last data filed at 03/10/2024 0034 Gross per 24 hour  Intake 1071.33 ml  Output --  Net 1071.33 ml     PHYSICAL EXAM General: Well-appearing male, well nourished, in no acute distress. HEENT: Normocephalic and atraumatic. Neck: No JVD.  Lungs: Normal respiratory effort on room air. Clear bilaterally to auscultation. No wheezes, crackles, rhonchi.  Heart: HRRR. Normal S1 and S2 without gallops or murmurs. Radial & DP pulses 2+ bilaterally. Abdomen: Non-distended appearing.   Msk: Normal strength and tone for age. Extremities: No clubbing, cyanosis or edema.   Neuro: Alert and oriented X 3. Psych: Mood appropriate, affect congruent.    LABS: Basic Metabolic Panel: Recent Labs    03/08/24 0414 03/10/24 0516  NA 135 137  K 4.1 3.9  CL 102 101  CO2 25 24  GLUCOSE 223* 152*  BUN 14 21  CREATININE 1.13 1.60*  CALCIUM  9.0 9.6   Liver Function Tests: No results for input(s): AST, ALT, ALKPHOS, BILITOT, PROT, ALBUMIN in the last 72 hours. No results for input(s): LIPASE, AMYLASE in the last 72 hours. CBC: Recent Labs    03/09/24 0418 03/10/24 0516  WBC 10.1 9.6  HGB 13.9 14.1  HCT 42.1 42.9  MCV 85.4 86.1  PLT 249 266   Cardiac Enzymes: No results for input(s): CKTOTAL, CKMB, CKMBINDEX, TROPONINIHS in the last 72 hours.  BNP: No results for input(s): BNP in the last 72 hours.  D-Dimer: No results for input(s): DDIMER in the last 72 hours. Hemoglobin A1C: No results for input(s): HGBA1C in the last 72 hours.  Fasting Lipid Panel: Recent Labs    03/09/24 0418  CHOL 137  HDL 33*  LDLCALC 76  TRIG 857  CHOLHDL 4.2   Thyroid Function Tests: No results for input(s): TSH, T4TOTAL, T3FREE, THYROIDAB in the last 72 hours.  Invalid input(s): FREET3 Anemia Panel: No results for input(s): VITAMINB12, FOLATE, FERRITIN, TIBC, IRON, RETICCTPCT in the last 72  hours.  NM Myocar Multi W/Spect W/Wall Motion / EF Result Date: 03/09/2024   Findings are consistent with ischemia. The study is high risk.   No ST deviation was noted.   LV perfusion is abnormal. There is evidence of ischemia. Defect 1: There is a large defect with severe reduction in uptake present in the apical inferior location(s) that is partially reversible. There is abnormal wall motion in the defect area. Consistent with ischemia.   Left ventricular function is abnormal. Nuclear stress EF: 39%. The left ventricular ejection fraction  is moderately decreased (30-44%). End systolic cavity size is moderately enlarged. Conclusion Moderately depressed left ventricular function  30 to 35% Moderate to severe left ventricular enlargement Large area of inferior apical ischemia This is a high risk study      ECHO as above  TELEMETRY reviewed by me 03/10/24: Sinus rhythm rate 90s  EKG reviewed by me 03/10/24: Sinus rhythm rate 97 bpm, prolonged QT.  DATA reviewed by me 03/10/24: last 24h vitals tele labs imaging I/O, hospitalist progress note  Principal Problem:   NSTEMI (non-ST elevated myocardial infarction) (HCC) Active Problems:   Uncontrolled type 2 diabetes mellitus with hyperglycemia, without long-term current use of insulin  (HCC)   OSA (obstructive sleep apnea)   Chronic obstructive pulmonary disease (COPD) (HCC)   Obesity, Class III, BMI 40-49.9 (morbid obesity)   Intermittent claudication / Left SFA stenosis  (HCC)   HLD (hyperlipidemia)   Substance abuse (HCC)   Tobacco use disorder    ASSESSMENT AND PLAN: Greg Hernandez is a 63 y.o. male with a past medical history of hypertension, hyperlipidemia, peripheral artery disease, type 2 diabetes, COPD, OSA (on  CPAP), tobacco use, cocaine use who presented to the ED on 03/05/2024 for elevated blood glucose, chest pain, shortness of breath.  Cardiology was consulted for further evaluation.  # Angina # Elevated troponin # Abnormal cardiac stress test # Cardiomyopathy # Uncontrolled diabetes # Cocaine use Patient presented with complaints of significantly elevated blood glucose, chest pain, shortness of breath.  Blood glucose in the ED greater than 600.  Troponins checked and trended 317 > 392.  UDS was positive for cocaine.  EKG without acute ischemic changes.  Echo this admission revealed EF 35-40%, inferior/posterior hypokinesis, mild to moderate LV dilation, mild concentric LVH, grade 1 diastolic dysfunction, normal RV size and function. LDL this admisison 76.  Cardiac stress test high risk study revealing ischemia, apical inferior location that is partially reversible.  - Continue IV heparin  gtt. plan to discontinue after LHC today. - Continue aspirin  81 mg daily, Crestor  40 mg daily.  Add Zetia  10 mg daily. - Continue spironolactone  to 25 mg daily.  Holding today given bump in creatinine. - Continue losartan  100 mg daily, metoprolol  succinate 25 mg daily.  Holding losartan  today given bump in creatinine. - Continue Farxiga  10 mg daily for GDMT optimization (Copay $4).  Holding today given bump in creatinine. - Plan for LHC with Dr. Florencio today yet at 1:30 pm. NPO on 08/20 at 0630.  -Discussed the risks and benefits of proceeding with LHC for further evaluation with the patient.  He is agreeable to proceed.  Scheduled with  Dr. Florencio on 08/19 at 1:30 PM.  Written consent will be obtained.  Further recommendations following LHC.   ADDENDUM: LHC finished, nonobstructive disease, no intervention indicated. Should be ok to go home later today if he does ok in recovery. Should follow up with his primary cardiologist at [facility] within 5 days  for further management of cardiomyopathy. Would hold farxiga  and spiro on discharge given Cr. Can resume outpatient as able.   This patient's case was discussed and created with Dr. Florencio and he is in agreement.  Signed:  Danita Bloch, PA-C  03/10/2024, 9:39 AM [facility]

## 2024-03-10 NOTE — Plan of Care (Signed)
  Problem: Education: Goal: Ability to describe self-care measures that may prevent or decrease complications (Diabetes Survival Skills Education) will improve Outcome: Progressing   Problem: Education: Goal: Individualized Educational Video(s) Outcome: Progressing   Problem: Coping: Goal: Ability to adjust to condition or change in health will improve Outcome: Progressing

## 2024-03-10 NOTE — Plan of Care (Signed)

## 2024-03-10 NOTE — Progress Notes (Signed)
 Progress Note   Patient: Greg Hernandez FMW:[id number] DOB: 1961-06-16 DOA: 03/05/2024     3 DOS: the patient was seen and examined on 03/10/2024   Brief hospital course: 63yo with h/o HTN, PAD, HLD, COPD, DM, tobacco use, cocaine use, and morbid obesity who presented on 8/16 with CP/SOB. Troponin elevated. C/W NSTEMI, started on Heparin . Echo with acute systolic dysfunction, EF 35-40% with WMA and grade 1 DD, consistent with MI. Cardiology consulting, heart cath scheduled 8/20.   Principal Problem:   NSTEMI (non-ST elevated myocardial infarction) (HCC) Active Problems:   Uncontrolled type 2 diabetes mellitus with hyperglycemia, without long-term current use of insulin  (HCC)   Intermittent claudication / Left SFA stenosis  (HCC)   HLD (hyperlipidemia)   Tobacco use disorder   Chronic obstructive pulmonary disease (COPD) (HCC)   OSA (obstructive sleep apnea)   Obesity, Class III, BMI 40-49.9 (morbid obesity)   Substance abuse (HCC)   Assessment and Plan: NSTEMI Acute systolic congestive heart failure secondary to non-STEMI. Patient presented with CP and DOE Echocardiogram showed ejection fraction 35 to 40% with regional abnormality.  Grade 1 diastolic dysfunction. Patient condition most likely due to acute congestive heart failure from non-STEMI. Echocardiogram also showed regional abnormality nuclear stress test was also positive. Patient is on heparin  drip until heart catheter is performed which is scheduled today.  Patient is also placed on aspirin , Crestor  and losartan .  Beta-blocker was also started. Cardiology has added spironolactone  and Farxiga     Uncontrolled type 2 diabetes mellitus with hyperglycemia, without long-term current use of insulin  A1c 12.4, very poor control Continue insulin  glargine and sliding scale insulin .  Glucose seems to be better controlled.  Acute kidney injury. Prior lab was reviewed: GFR was normal.  Renal function is worsened with a creatinine of  1.6, received a fluid bolus, will also check bladder scan to rule out urinary retention.   HLD Lipid panel with LDL 76 Started on atorvastatin   Intermittent claudication / Left SFA stenosis / HLD Has seen vascular in the past March 2025 L SFA stenosis of 50-75%, R ABI 0.91 with TP 58 and L ABI 0.8 with TP 52  Continue aspirin  and rosuvastatin     Chronic obstructive pulmonary disease (COPD) with ongoing tobacco use No exacerbation, continue home treatment   HTN Continue losartan  Started on Toprol  XL, spironolactone    Obstructive sleep apnea Continue CPAP   Obesity, Class III, BMI 40-49.9 (morbid obesity) Body mass index is 45.19 kg/m.SABRA  Weight loss should be encouraged on an ongoing basis   Substance abuse  UDS + cocaine Advised to quit.        Subjective:  Patient currently denies any short of breath or chest pain.  Physical Exam: Vitals:   03/10/24 0433 03/10/24 0500 03/10/24 0900 03/10/24 1104  BP: (!) 135/58  131/63 125/61  Pulse: 91  82 84  Resp: 18  18 18   Temp: 98.3 F (36.8 C)  98.1 F (36.7 C) 97.8 F (36.6 C)  TempSrc:    Oral  SpO2: 96%  98% 98%  Weight:  (!) 138.8 kg    Height:       General exam: Appears calm and comfortable  Respiratory system: Clear to auscultation. Respiratory effort normal. Cardiovascular system: S1 & S2 heard, RRR. No JVD, murmurs, rubs, gallops or clicks. No pedal edema. Gastrointestinal system: Abdomen is nondistended, soft and nontender. No organomegaly or masses felt. Normal bowel sounds heard. Central nervous system: Alert and oriented. No focal neurological deficits. Extremities:  Symmetric 5 x 5 power. Skin: No rashes, lesions or ulcers Psychiatry: Judgement and insight appear normal. Mood & affect appropriate. '   Data Reviewed:  Echocardiogram results, chest x-ray and lab results  Family Communication: Wife updated over the phone  Disposition: Status is: Inpatient Remains inpatient appropriate because:  Severity of disease, inpatient procedure, IV treatment     Time spent: 60 minutes  Author: Murvin Mana, MD 03/10/2024 2:08 PM  For on call review [email or web address].

## 2024-03-11 ENCOUNTER — Other Ambulatory Visit: Payer: Self-pay

## 2024-03-11 DIAGNOSIS — E1165 Type 2 diabetes mellitus with hyperglycemia: Secondary | ICD-10-CM | POA: Diagnosis not present

## 2024-03-11 DIAGNOSIS — G4733 Obstructive sleep apnea (adult) (pediatric): Secondary | ICD-10-CM

## 2024-03-11 DIAGNOSIS — I214 Non-ST elevation (NSTEMI) myocardial infarction: Secondary | ICD-10-CM | POA: Diagnosis not present

## 2024-03-11 LAB — CBC
HCT: 44.8 % (ref 39.0–52.0)
Hemoglobin: 14.7 g/dL (ref 13.0–17.0)
MCH: 28.7 pg (ref 26.0–34.0)
MCHC: 32.8 g/dL (ref 30.0–36.0)
MCV: 87.3 fL (ref 80.0–100.0)
Platelets: 234 K/uL (ref 150–400)
RBC: 5.13 MIL/uL (ref 4.22–5.81)
RDW: 14.7 % (ref 11.5–15.5)
WBC: 7.6 K/uL (ref 4.0–10.5)
nRBC: 0 % (ref 0.0–0.2)

## 2024-03-11 LAB — BASIC METABOLIC PANEL WITH GFR
Anion gap: 5 (ref 5–15)
BUN: 14 mg/dL (ref 8–23)
CO2: 25 mmol/L (ref 22–32)
Calcium: 9.2 mg/dL (ref 8.9–10.3)
Chloride: 106 mmol/L (ref 98–111)
Creatinine, Ser: 1.32 mg/dL — ABNORMAL HIGH (ref 0.61–1.24)
GFR, Estimated: >60 mL/min (ref >60)
Glucose, Bld: 127 mg/dL — ABNORMAL HIGH (ref 70–99)
Potassium: 4.7 mmol/L (ref 3.5–5.1)
Sodium: 136 mmol/L (ref 135–145)

## 2024-03-11 LAB — GLUCOSE, CAPILLARY: Glucose-Capillary: 276 mg/dL — ABNORMAL HIGH (ref 70–99)

## 2024-03-11 MED ORDER — ACCU-CHEK SOFTCLIX LANCETS MISC
1.0000 | Freq: Three times a day (TID) | 0 refills | Status: AC
Start: 2024-03-11 — End: ?
  Filled 2024-03-11: qty 100, 30d supply, fill #0

## 2024-03-11 MED ORDER — EZETIMIBE 10 MG PO TABS
10.0000 mg | ORAL_TABLET | Freq: Every day | ORAL | 0 refills | Status: DC
  Filled 2024-03-11: qty 30, 30d supply, fill #0

## 2024-03-11 MED ORDER — METOPROLOL SUCCINATE ER 25 MG PO TB24
25.0000 mg | ORAL_TABLET | Freq: Every day | ORAL | 0 refills | Status: DC
  Filled 2024-03-11: qty 30, 30d supply, fill #0

## 2024-03-11 MED ORDER — INSULIN PEN NEEDLE 32G X 4 MM MISC
1.0000 | Freq: Three times a day (TID) | 0 refills | Status: AC
  Filled 2024-03-11: qty 100, 30d supply, fill #0

## 2024-03-11 MED ORDER — BLOOD GLUCOSE TEST VI STRP
1.0000 | ORAL_STRIP | Freq: Three times a day (TID) | 0 refills | Status: AC
Start: 2024-03-11 — End: ?
  Filled 2024-03-11: qty 100, 34d supply, fill #0

## 2024-03-11 MED ORDER — LANCET DEVICE MISC
1.0000 | Freq: Three times a day (TID) | 0 refills | Status: AC
Start: 2024-03-11 — End: ?
  Filled 2024-03-11: qty 1, fill #0

## 2024-03-11 MED ORDER — DAPAGLIFLOZIN PROPANEDIOL 10 MG PO TABS
10.0000 mg | ORAL_TABLET | Freq: Every day | ORAL | 0 refills | Status: DC
  Filled 2024-03-11: qty 30, 30d supply, fill #0

## 2024-03-11 MED ORDER — BLOOD GLUCOSE MONITOR SYSTEM W/DEVICE KIT
1.0000 | PACK | Freq: Three times a day (TID) | 0 refills | Status: AC
Start: 2024-03-11 — End: ?
  Filled 2024-03-11: qty 1, 30d supply, fill #0

## 2024-03-11 MED ORDER — BAQSIMI TWO PACK 3 MG/DOSE NA POWD
3.0000 mg | NASAL | 0 refills | Status: AC | PRN
  Filled 2024-03-11: qty 2, 1d supply, fill #0

## 2024-03-11 MED ORDER — BASAGLAR KWIKPEN 100 UNIT/ML ~~LOC~~ SOPN
20.0000 [IU] | PEN_INJECTOR | Freq: Every day | SUBCUTANEOUS | 0 refills | Status: AC
Start: 2024-03-11 — End: ?
  Filled 2024-03-11: qty 15, 75d supply, fill #0

## 2024-03-11 MED ORDER — INSULIN ASPART 100 UNIT/ML FLEXPEN
4.0000 [IU] | PEN_INJECTOR | Freq: Three times a day (TID) | SUBCUTANEOUS | 0 refills | Status: AC
  Filled 2024-03-11: qty 9, 75d supply, fill #0

## 2024-03-11 MED ORDER — SPIRONOLACTONE 25 MG PO TABS
25.0000 mg | ORAL_TABLET | Freq: Every day | ORAL | 0 refills | Status: DC
  Filled 2024-03-11: qty 30, 30d supply, fill #0

## 2024-03-11 NOTE — Progress Notes (Incomplete)
 Patient ID: Greg Hernandez, male   DOB: 30-Jan-1961, 63 y.o.   MRN: [id number] [facility]    SUBJECTIVE: ***   Vitals:   03/10/24 1745 03/10/24 2001 03/10/24 2337 03/11/24 0321  BP: (!) 118/49 129/83 129/60 134/68  Pulse: 89 91 86 80  Resp: 18 18 18 17   Temp: (!) 97.5 F (36.4 C) 97.9 F (36.6 C) 98.5 F (36.9 C) 98.6 F (37 C)  TempSrc: Temporal  Oral Oral  SpO2: 95% 98% 94% 100%  Weight:      Height:        No intake or output data in the 24 hours ending 03/11/24 0759    PHYSICAL EXAM  General: Well developed, well nourished, in no acute distress HEENT:  Normocephalic and atramatic Neck:  No JVD.  Lungs: Clear bilaterally to auscultation and percussion. Heart: HRRR . Normal S1 and S2 without gallops or murmurs.  Abdomen: Bowel sounds are positive, abdomen soft and non-tender  Msk:  Back normal, normal gait. Normal strength and tone for age. Extremities: No clubbing, cyanosis or edema.   Neuro: Alert and oriented X 3. Psych:  Good affect, responds appropriately   LABS: Basic Metabolic Panel: Recent Labs    03/10/24 0516 03/10/24 1107 03/11/24 0412  NA 137  --  136  K 3.9  --  4.7  CL 101  --  106  CO2 24  --  25  GLUCOSE 152*  --  127*  BUN 21  --  14  CREATININE 1.60* 1.49* 1.32*  CALCIUM  9.6  --  9.2   Liver Function Tests: No results for input(s): AST, ALT, ALKPHOS, BILITOT, PROT, ALBUMIN in the last 72 hours. No results for input(s): LIPASE, AMYLASE in the last 72 hours. CBC: Recent Labs    03/09/24 0418 03/10/24 0516  WBC 10.1 9.6  HGB 13.9 14.1  HCT 42.1 42.9  MCV 85.4 86.1  PLT 249 266   Cardiac Enzymes: No results for input(s): CKTOTAL, CKMB, CKMBINDEX, TROPONINI in the last 72 hours. BNP: Invalid input(s): POCBNP D-Dimer: No results for input(s): DDIMER in the last 72 hours. Hemoglobin A1C: No results for input(s): HGBA1C in the last 72 hours. Fasting Lipid Panel: Recent Labs    03/09/24 0418   CHOL 137  HDL 33*  LDLCALC 76  TRIG 857  CHOLHDL 4.2   Thyroid Function Tests: No results for input(s): TSH, T4TOTAL, T3FREE, THYROIDAB in the last 72 hours.  Invalid input(s): FREET3 Anemia Panel: No results for input(s): VITAMINB12, FOLATE, FERRITIN, TIBC, IRON, RETICCTPCT in the last 72 hours.  NM Myocar Multi W/Spect W/Wall Motion / EF Result Date: 03/09/2024   Findings are consistent with ischemia. The study is high risk.   No ST deviation was noted.   LV perfusion is abnormal. There is evidence of ischemia. Defect 1: There is a large defect with severe reduction in uptake present in the apical inferior location(s) that is partially reversible. There is abnormal wall motion in the defect area. Consistent with ischemia.   Left ventricular function is abnormal. Nuclear stress EF: 39%. The left ventricular ejection fraction is moderately decreased (30-44%). End systolic cavity size is moderately enlarged. Conclusion Moderately depressed left ventricular function  30 to 35% Moderate to severe left ventricular enlargement Large area of inferior apical ischemia This is a high risk study     Echo ***  TELEMETRY: ***:  ASSESSMENT AND PLAN:  Principal Problem:   NSTEMI (non-ST elevated myocardial infarction) (HCC) Active Problems:   Uncontrolled  type 2 diabetes mellitus with hyperglycemia, without long-term current use of insulin  (HCC)   OSA (obstructive sleep apnea)   Chronic obstructive pulmonary disease (COPD) (HCC)   Obesity, Class III, BMI 40-49.9 (morbid obesity)   Intermittent claudication / Left SFA stenosis  (HCC)   HLD (hyperlipidemia)   Substance abuse (HCC)   Tobacco use disorder    1. ***   Cara JONETTA Lovelace, MD, PHD FACC 03/11/2024 7:59 AM

## 2024-03-11 NOTE — Plan of Care (Signed)
  Problem: Activity: Goal: Ability to tolerate increased activity will improve Outcome: Progressing   Problem: Cardiac: Goal: Ability to achieve and maintain adequate cardiovascular perfusion will improve Outcome: Progressing   Problem: Education: Goal: Knowledge of General Education information will improve Description: Including pain rating scale, medication(s)/side effects and non-pharmacologic comfort measures Outcome: Progressing   Problem: Clinical Measurements: Goal: Cardiovascular complication will be avoided Outcome: Progressing   Problem: Cardiovascular: Goal: Vascular access site(s) Level 0-1 will be maintained Outcome: Progressing   Problem: Pain Managment: Goal: General experience of comfort will improve and/or be controlled Outcome: Progressing   Problem: Safety: Goal: Ability to remain free from injury will improve Outcome: Progressing

## 2024-03-11 NOTE — Discharge Summary (Addendum)
 Physician Discharge Summary   Patient: Greg Hernandez MRN: [id number] DOB: 1960/09/17  Admit date:     03/05/2024  Discharge date: 03/11/24  Discharge Physician: Murvin Mana   PCP: Perri Constance Sor, PA-C   Recommendations at discharge:   Follow-up with PCP in 1 week. Follow-up with cardiology as scheduled. Check a BMP in the next office visit. Adjust insulin  dose based on glucose level.  Discharge Diagnoses: Principal Problem:   NSTEMI (non-ST elevated myocardial infarction) (HCC) Active Problems:   Uncontrolled type 2 diabetes mellitus with hyperglycemia, without long-term current use of insulin  (HCC)   Intermittent claudication / Left SFA stenosis  (HCC)   HLD (hyperlipidemia)   Tobacco use disorder   Chronic obstructive pulmonary disease (COPD) (HCC)   OSA (obstructive sleep apnea)   Obesity, Class III, BMI 40-49.9 (morbid obesity)   Substance abuse (HCC)  Resolved Problems:   * No resolved hospital problems. [facility] Hospital Course: 63yo with h/o HTN, PAD, HLD, COPD, DM, tobacco use, cocaine use, and morbid obesity who presented on 8/16 with CP/SOB. Troponin elevated. C/W NSTEMI, started on Heparin . Echo with acute systolic dysfunction, EF 35-40% with WMA and grade 1 DD, consistent with MI. Cardiology consulting, heart cath 75% stenosis in LAD.  However, nuclear scan showed inferior ischemia.  LAD stenosis does not explain inferior changes.  Patient has nonischemic cardiomyopathy. Condition has improved today, renal function will stable.  Medically stable for discharge.  Assessment and Plan: NSTEMI Acute systolic congestive heart failure secondary to non-STEMI. Patient presented with CP and DOE Echocardiogram showed ejection fraction 35 to 40% with regional abnormality.  Grade 1 diastolic dysfunction. Patient condition most likely due to acute congestive heart failure from non-STEMI. Echocardiogram also showed regional abnormality nuclear stress test was also positive.   Nuclear stress test showed inferior ischemia. He was treated with heparin  drip.  Cardiac cath was performed 8/20, showed 75% stenosis in LAD, this is not in consistent with inferior ischemia on nuclear scan.  As a result, patient congestive heart failure was thought to be due to nonischemic cardiomyopathy. GDMT for heart failure was initiated, patient will follow with cardiology to titrate dose.     Uncontrolled type 2 diabetes mellitus with hyperglycemia, without long-term current use of insulin  A1c 12.4, very poor control Patient will be started on insulin  at discharge, follow-up with PCP to adjust dose.   Acute kidney injury. Prior lab was reviewed: GFR was normal.  Renal function is worsened with a creatinine of 1.6, received a fluid bolus, function is better.   HLD Lipid panel with LDL 76 Started on atorvastatin, was also started on Zetia .   Intermittent claudication / Left SFA stenosis / HLD Has seen vascular in the past March 2025 L SFA stenosis of 50-75%, R ABI 0.91 with TP 58 and L ABI 0.8 with TP 52  Continue aspirin  and rosuvastatin     Chronic obstructive pulmonary disease (COPD) with ongoing tobacco use No exacerbation, continue home treatment   HTN Continue losartan  Started on Toprol  XL, spironolactone    Obstructive sleep apnea Continue CPAP   Obesity, Class III, BMI 40-49.9 (morbid obesity) Body mass index is 45.19 kg/m.SABRA  Weight loss should be encouraged on an ongoing basis   Substance abuse  UDS + cocaine Advised to quit.       Consultants: Cardiology Procedures performed: Heart cath. Disposition: Home Diet recommendation:  Discharge Diet Orders (From admission, onward)     Start     Ordered   03/11/24 0000  Diet Carb Modified        03/11/24 1030           Carb modified diet DISCHARGE MEDICATION: Allergies as of 03/11/2024       Reactions   Metformin Diarrhea        Medication List     STOP taking these medications     hydrochlorothiazide 25 MG tablet Commonly known as: HYDRODIURIL       TAKE these medications    Advair Diskus 250-50 MCG/ACT Aepb Generic drug: fluticasone -salmeterol Inhale 1 puff into the lungs every 12 (twelve) hours.   albuterol  108 (90 Base) MCG/ACT inhaler Commonly known as: VENTOLIN  HFA Inhale 2 puffs into the lungs every 6 (six) hours as needed for wheezing or shortness of breath.   aspirin  EC 81 MG tablet Take 81 mg by mouth daily.   Baqsimi  Two Pack 3 MG/DOSE Powd Generic drug: Glucagon  Place 3 mg into the nose as needed for up to 2 doses (Severe low blood sugar). Give 3 mg (one actuation) into a single nostril.   Basaglar  KwikPen 100 UNIT/ML Inject 20 Units into the skin daily. May substitute as needed per insurance.   Blood Glucose Monitoring Suppl Devi 1 each by Does not apply route 3 (three) times daily. May dispense any manufacturer covered by patient's insurance.   BLOOD GLUCOSE TEST STRIPS Strp 1 each by Does not apply route 3 (three) times daily. Use as directed to check blood sugar. May dispense any manufacturer covered by patient's insurance and fits patient's device.   dapagliflozin  propanediol 10 MG Tabs tablet Commonly known as: FARXIGA  Take 1 tablet (10 mg total) by mouth daily. Start taking on: March 12, 2024   ezetimibe  10 MG tablet Commonly known as: ZETIA  Take 1 tablet (10 mg total) by mouth daily. Start taking on: March 12, 2024   insulin  aspart 100 UNIT/ML FlexPen Commonly known as: NOVOLOG  Inject 4 Units into the skin 3 (three) times daily with meals. Only take if eating a meal AND Blood Glucose (BG) is 80 or higher.   ipratropium 0.03 % nasal spray Commonly known as: ATROVENT Place 1 spray into both nostrils 2 (two) times daily as needed for rhinitis.   Lancet Device Misc 1 each by Does not apply route 3 (three) times daily. May dispense any manufacturer covered by patient's insurance.   Lancets Misc 1 each by Does not apply  route 3 (three) times daily. Use as directed to check blood sugar. May dispense any manufacturer covered by patient's insurance and fits patient's device.   losartan  100 MG tablet Commonly known as: COZAAR  Take 1 tablet by mouth daily.   metoprolol  succinate 25 MG 24 hr tablet Commonly known as: TOPROL -XL Take 1 tablet (25 mg total) by mouth daily. Start taking on: March 12, 2024   montelukast  10 MG tablet Commonly known as: SINGULAIR  Take 10 mg by mouth at bedtime.   nicotine  14 mg/24hr patch Commonly known as: NICODERM CQ  - dosed in mg/24 hours Place 14 mg onto the skin daily.   nicotine  polacrilex 2 MG gum Commonly known as: NICORETTE Take 2 mg by mouth as needed.   Ozempic (1 MG/DOSE) 4 MG/3ML Sopn Generic drug: Semaglutide (1 MG/DOSE) Inject 1 mg into the skin once a week.   Pen Needles 31G X 5 MM Misc 1 each by Does not apply route 3 (three) times daily. May dispense any manufacturer covered by patient's insurance.   rosuvastatin  40 MG tablet Commonly known as: CRESTOR  Take  1 tablet by mouth daily.   spironolactone  25 MG tablet Commonly known as: ALDACTONE  Take 1 tablet (25 mg total) by mouth daily. Start taking on: March 12, 2024        Follow-up Information     Jama Margery ORN, MD. Go in 1 week(s).   Specialty: Cardiology Contact information: [address] [place] [address] [phone]         Perri Constance Sor, PA-C Follow up in 1 week(s).   Specialty: Physician Assistant Contact information: [address] [place] [address] [phone]                Discharge Exam: Fredricka Weights   03/05/24 2343 03/10/24 0500 03/10/24 1423  Weight: (!) 138.8 kg (!) 138.8 kg (!) 138.8 kg   General exam: Appears calm and comfortable, morbid obesity Respiratory system: Clear to auscultation. Respiratory effort normal. Cardiovascular system: S1 & S2 heard, RRR. No JVD, murmurs, rubs, gallops or clicks. No pedal  edema. Gastrointestinal system: Abdomen is nondistended, soft and nontender. No organomegaly or masses felt. Normal bowel sounds heard. Central nervous system: Alert and oriented. No focal neurological deficits. Extremities: Symmetric 5 x 5 power. Skin: No rashes, lesions or ulcers Psychiatry: Judgement and insight appear normal. Mood & affect appropriate.    Condition at discharge: good  The results of significant diagnostics from this hospitalization (including imaging, microbiology, ancillary and laboratory) are listed below for reference.   Imaging Studies: NM Myocar Multi W/Spect W/Wall Motion / EF Result Date: 03/09/2024   Findings are consistent with ischemia. The study is high risk.   No ST deviation was noted.   LV perfusion is abnormal. There is evidence of ischemia. Defect 1: There is a large defect with severe reduction in uptake present in the apical inferior location(s) that is partially reversible. There is abnormal wall motion in the defect area. Consistent with ischemia.   Left ventricular function is abnormal. Nuclear stress EF: 39%. The left ventricular ejection fraction is moderately decreased (30-44%). End systolic cavity size is moderately enlarged. Conclusion Moderately depressed left ventricular function  30 to 35% Moderate to severe left ventricular enlargement Large area of inferior apical ischemia This is a high risk study   ECHOCARDIOGRAM COMPLETE Result Date: 03/06/2024    ECHOCARDIOGRAM REPORT   Patient Name:   HENCE DERRICK Date of Exam: 03/06/2024 Medical Rec #:  [id number]        Height:       69.0 in Accession #:    [id number]       Weight:       306.0 lb Date of Birth:  01/04/1961         BSA:          2.475 m Patient Age:    63 years         BP:           129/72 mmHg Patient Gender: M                HR:           86 bpm. Exam Location:  [facility] Procedure: 2D Echo, Cardiac Doppler, Color Doppler and Intracardiac            Opacification Agent (Both Spectral and Color  Flow Doppler were            utilized during procedure). Indications:     NSTEMI I21.4  History:  Patient has no prior history of Echocardiogram examinations.                  Risk Factors:Hypertension.  Sonographer:     Bari Roar Referring Phys:  [id number] DELAYNE LULLA SOLIAN Diagnosing Phys: Cara JONETTA Lovelace MD IMPRESSIONS  1. Inferior/posterior hypo.  2. Left ventricular ejection fraction, by estimation, is 35 to 40%. The left ventricle has moderately decreased function. The left ventricle demonstrates regional wall motion abnormalities (see scoring diagram/findings for description). The left ventricular internal cavity size was mildly to moderately dilated. There is mild concentric left ventricular hypertrophy. Left ventricular diastolic parameters are consistent with Grade I diastolic dysfunction (impaired relaxation).  3. Right ventricular systolic function is normal. The right ventricular size is normal.  4. The mitral valve is normal in structure. No evidence of mitral valve regurgitation.  5. The aortic valve is normal in structure. Aortic valve regurgitation is not visualized. FINDINGS  Left Ventricle: Left ventricular ejection fraction, by estimation, is 35 to 40%. The left ventricle has moderately decreased function. The left ventricle demonstrates regional wall motion abnormalities. Definity  contrast agent was given IV to delineate the left ventricular endocardial borders. Strain was performed and the global longitudinal strain is indeterminate. Global longitudinal strain performed but not reported based on interpreter judgement due to suboptimal tracking. The left ventricular internal cavity size was mildly to moderately dilated. There is mild concentric left ventricular hypertrophy. Left ventricular diastolic parameters are consistent with Grade I diastolic dysfunction (impaired relaxation). Right Ventricle: The right ventricular size is normal. No increase in right ventricular wall thickness.  Right ventricular systolic function is normal. Left Atrium: Left atrial size was normal in size. Right Atrium: Right atrial size was normal in size. Pericardium: There is no evidence of pericardial effusion. Mitral Valve: The mitral valve is normal in structure. No evidence of mitral valve regurgitation. MV peak gradient, 3.2 mmHg. The mean mitral valve gradient is 2.0 mmHg. Tricuspid Valve: The tricuspid valve is normal in structure. Tricuspid valve regurgitation is not demonstrated. Aortic Valve: The aortic valve is normal in structure. Aortic valve regurgitation is not visualized. Aortic valve mean gradient measures 4.0 mmHg. Aortic valve peak gradient measures 9.1 mmHg. Aortic valve area, by VTI measures 2.28 cm. Pulmonic Valve: The pulmonic valve was normal in structure. Pulmonic valve regurgitation is not visualized. Aorta: The ascending aorta was not well visualized. IAS/Shunts: No atrial level shunt detected by color flow Doppler. Additional Comments: Inferior/posterior hypo. 3D was performed not requiring image post processing on an independent workstation and was indeterminate.  LEFT VENTRICLE PLAX 2D LVIDd:         5.50 cm      Diastology LVIDs:         4.70 cm      LV e' medial:    7.51 cm/s LV PW:         1.20 cm      LV E/e' medial:  14.1 LV IVS:        1.30 cm      LV e' lateral:   6.31 cm/s LVOT diam:     2.00 cm      LV E/e' lateral: 16.8 LV SV:         60 LV SV Index:   24 LVOT Area:     3.14 cm  LV Volumes (MOD) LV vol d, MOD A2C: 149.0 ml LV vol d, MOD A4C: 141.0 ml LV vol s, MOD A2C: 97.1 ml LV vol s, MOD  A4C: 90.1 ml LV SV MOD A2C:     51.9 ml LV SV MOD A4C:     141.0 ml LV SV MOD BP:      55.0 ml RIGHT VENTRICLE RV Basal diam:  3.00 cm RV Mid diam:    2.80 cm RV S prime:     13.70 cm/s TAPSE (M-mode): 2.1 cm LEFT ATRIUM             Index        RIGHT ATRIUM           Index LA diam:        3.90 cm 1.58 cm/m   RA Area:     14.30 cm LA Vol (A2C):   46.3 ml 18.70 ml/m  RA Volume:   34.00 ml   13.74 ml/m LA Vol (A4C):   47.1 ml 19.03 ml/m LA Biplane Vol: 47.6 ml 19.23 ml/m  AORTIC VALVE                    PULMONIC VALVE AV Area (Vmax):    2.01 cm     PV Vmax:        1.09 m/s AV Area (Vmean):   1.90 cm     PV Peak grad:   4.8 mmHg AV Area (VTI):     2.28 cm     RVOT Peak grad: 2 mmHg AV Vmax:           151.00 cm/s AV Vmean:          93.800 cm/s AV VTI:            0.262 m AV Peak Grad:      9.1 mmHg AV Mean Grad:      4.0 mmHg LVOT Vmax:         96.60 cm/s LVOT Vmean:        56.700 cm/s LVOT VTI:          0.190 m LVOT/AV VTI ratio: 0.73  AORTA Ao Root diam: 2.60 cm Ao Asc diam:  2.80 cm MITRAL VALVE                TRICUSPID VALVE MV Area (PHT): 3.56 cm     TR Peak grad:   19.5 mmHg MV Area VTI:   2.62 cm     TR Vmax:        221.00 cm/s MV Peak grad:  3.2 mmHg MV Mean grad:  2.0 mmHg     SHUNTS MV Vmax:       0.90 m/s     Systemic VTI:  0.19 m MV Vmean:      62.7 cm/s    Systemic Diam: 2.00 cm MV Decel Time: 213 msec MV E velocity: 106.00 cm/s MV A velocity: 114.00 cm/s MV E/A ratio:  0.93 MV A Prime:    12.6 cm/s Cara JONETTA Lovelace MD Electronically signed by Cara JONETTA Lovelace MD Signature Date/Time: 03/06/2024/1:10:21 PM    Final    DG Chest Portable 1 View Result Date: 03/06/2024 CLINICAL DATA:  Chest pain leg edema EXAM: PORTABLE CHEST 1 VIEW COMPARISON:  07/24/2011 FINDINGS: Mild cardiomegaly. No acute airspace disease, pleural effusion or pneumothorax IMPRESSION: No active disease. Mild cardiomegaly. Electronically Signed   By: Luke Bun M.D.   On: 03/06/2024 01:08    Microbiology: No results found for this or any previous visit.  Labs: CBC: Recent Labs  Lab 03/07/24 0312 03/08/24 0414 03/09/24 0418 03/10/24 0516 03/11/24 0617  WBC  9.5 7.9 10.1 9.6 7.6  HGB 12.8* 13.5 13.9 14.1 14.7  HCT 37.4* 39.5 42.1 42.9 44.8  MCV 83.7 84.0 85.4 86.1 87.3  PLT 226 247 249 266 234   Basic Metabolic Panel: Recent Labs  Lab 03/06/24 1050 03/07/24 0312 03/08/24 0414 03/10/24 0516  03/10/24 1107 03/11/24 0412  NA 136 134* 135 137  --  136  K 3.9 3.9 4.1 3.9  --  4.7  CL 103 101 102 101  --  106  CO2 25 25 25 24   --  25  GLUCOSE 184* 251* 223* 152*  --  127*  BUN 19 16 14 21   --  14  CREATININE 1.23 1.21 1.13 1.60* 1.49* 1.32*  CALCIUM  9.1 8.9 9.0 9.6  --  9.2   Liver Function Tests: No results for input(s): AST, ALT, ALKPHOS, BILITOT, PROT, ALBUMIN in the last 168 hours. CBG: Recent Labs  Lab 03/10/24 0852 03/10/24 1247 03/10/24 1428 03/10/24 2038 03/11/24 0838  GLUCAP 167* 119* 106* 189* 276*    Discharge time spent: 40 minutes.  Signed: Murvin Mana, MD [facility] 03/11/2024

## 2024-03-13 LAB — CARDIAC CATHETERIZATION: Cath EF Quantitative: 40 %

## 2024-03-15 ENCOUNTER — Encounter: Payer: Self-pay | Admitting: Internal Medicine

## 2024-04-05 ENCOUNTER — Other Ambulatory Visit: Payer: Self-pay

## 2024-04-07 ENCOUNTER — Other Ambulatory Visit: Payer: Self-pay

## 2024-04-07 MED ORDER — DAPAGLIFLOZIN PROPANEDIOL 10 MG PO TABS
10.0000 mg | ORAL_TABLET | Freq: Every day | ORAL | 3 refills | Status: AC
  Filled 2024-04-07: qty 90, 90d supply, fill #0

## 2024-04-07 MED ORDER — SPIRONOLACTONE 25 MG PO TABS
25.0000 mg | ORAL_TABLET | Freq: Every day | ORAL | 3 refills | Status: AC
  Filled 2024-04-07: qty 90, 90d supply, fill #0

## 2024-04-07 MED ORDER — EZETIMIBE 10 MG PO TABS
10.0000 mg | ORAL_TABLET | Freq: Every day | ORAL | 3 refills | Status: AC
  Filled 2024-04-07: qty 90, 90d supply, fill #0

## 2024-04-07 MED ORDER — METOPROLOL SUCCINATE ER 25 MG PO TB24
25.0000 mg | ORAL_TABLET | Freq: Every day | ORAL | 3 refills | Status: AC
  Filled 2024-04-07: qty 90, 90d supply, fill #0

## 2024-04-12 ENCOUNTER — Other Ambulatory Visit: Payer: Self-pay

## 2024-06-02 LAB — GLUCOSE, POCT (MANUAL RESULT ENTRY): POC Glucose: 120 mg/dL — AB (ref 70–99)

## 2024-06-09 LAB — GLUCOSE, POCT (MANUAL RESULT ENTRY): POC Glucose: 117 mg/dL — AB (ref 70–99)

## 2024-06-22 NOTE — Congregational Nurse Program (Signed)
  Dept: [phone]   Congregational Nurse Program Note  Date of Encounter: 06/09/2024  Clinic visit to check blood pressure, blood glucose and weight.  Blood glucose 117 pc breakfast, discussed normal blood glucose levels and strategies to prevent high blood glucose. Past Medical History: Past Medical History:  Diagnosis Date   Hypertension     Encounter Details:  Community Questionnaire - 06/09/24 1140       Questionnaire   Ask client: Do you give verbal consent for me to treat you today? Yes    Student Assistance N/A    Location Patient Served  [facility]    Encounter Setting CN site    Population Status Unknown   Has apartment near [employer]   Insurance Medicaid    Insurance/Financial Assistance Referral N/A    Medication Have Medication Insecurities    Medical Provider Yes    Screening Referrals Made N/A    Medical Referrals Made N/A    Medical Appointment Completed N/A    CNP Interventions Advocate/Support;Counsel;Case Management    Screenings CN Performed Weight;Blood Pressure;Blood Glucose    ED Visit Averted N/A    Life-Saving Intervention Made N/A

## 2024-06-23 LAB — GLUCOSE, POCT (MANUAL RESULT ENTRY): POC Glucose: 110 mg/dL — AB (ref 70–99)

## 2024-06-23 NOTE — Congregational Nurse Program (Signed)
  Dept: [phone]   Congregational Nurse Program Note  Date of Encounter: 06/23/2024  Clinic visit to check blood pressure, blood glucose and weight and to assist with application of Dexcom G7 sensor for continuous glucose monitoring. BP 106/70, pulse 88 and regular, weight 295. Dexcom sensor applied to dorsal side of left upper arm and paired with the monitoring device.  Given demonstration of how to monitor for accuracy of the sensor.  Past Medical History: Past Medical History:  Diagnosis Date   Hypertension     Encounter Details:

## 2024-06-30 NOTE — Congregational Nurse Program (Signed)
°  Dept: [phone]   Congregational Nurse Program Note  Date of Encounter: 06/03/2024  Telephone call to determine if Dexcom G7 sensor was in place and working.  Unable to obtain glucose readings despite pairing with his cell phone.  Advised him to check the internet setting to determine if it supports the glucose monitoring. Past Medical History: Past Medical History:  Diagnosis Date   Hypertension     Encounter Details:  Community Questionnaire - 06/03/24 1719       Questionnaire   Ask client: Do you give verbal consent for me to treat you today? Yes    Student Assistance N/A    Location Patient Served  N/A    Encounter Setting Phone/Text/Email    Population Status Unknown    Insurance Medicaid    Insurance/Financial Assistance Referral N/A    Medication Have Medication Insecurities    Medical Provider Yes    Screening Referrals Made N/A    Medical Referrals Made N/A    Medical Appointment Completed N/A    CNP Interventions Advocate/Support;Counsel;Case Management;[facility]igate Healthcare [facility]    Screenings CN Performed Weight;N/A    ED Visit Averted N/A    Life-Saving Intervention Made N/A

## 2024-07-07 NOTE — Congregational Nurse Program (Signed)
°  Dept: [phone]   Congregational Nurse Program Note  Date of Encounter: 06/02/2024  Clinic visit to check blood pressure, weight and blood glucose.  Dexcom G7 applied to right upper arm per his request but unable to get the sensor to work with his cell phone, advised him to contact his PCP office. Past Medical History: Past Medical History:  Diagnosis Date   Hypertension     Encounter Details:
